# Patient Record
Sex: Male | Born: 1955 | Race: White | Hispanic: No | Marital: Married | Smoking: Never smoker
Health system: Southern US, Community
[De-identification: ages and names within clinical notes are randomized; demographics above are authoritative.]

## PROBLEM LIST (undated history)

## (undated) DIAGNOSIS — I10 Essential (primary) hypertension: Secondary | ICD-10-CM

## (undated) DIAGNOSIS — E785 Hyperlipidemia, unspecified: Secondary | ICD-10-CM

## (undated) DIAGNOSIS — H3322 Serous retinal detachment, left eye: Secondary | ICD-10-CM

---

## 2004-08-02 ENCOUNTER — Ambulatory Visit: Payer: Self-pay | Admitting: Unknown Physician Specialty

## 2009-04-11 ENCOUNTER — Ambulatory Visit: Payer: Self-pay | Admitting: Unknown Physician Specialty

## 2019-05-11 DIAGNOSIS — L570 Actinic keratosis: Secondary | ICD-10-CM | POA: Diagnosis not present

## 2019-05-11 DIAGNOSIS — D0461 Carcinoma in situ of skin of right upper limb, including shoulder: Secondary | ICD-10-CM | POA: Diagnosis not present

## 2019-05-11 DIAGNOSIS — X32XXXA Exposure to sunlight, initial encounter: Secondary | ICD-10-CM | POA: Diagnosis not present

## 2019-05-26 DIAGNOSIS — R110 Nausea: Secondary | ICD-10-CM | POA: Diagnosis not present

## 2019-05-26 DIAGNOSIS — R1011 Right upper quadrant pain: Secondary | ICD-10-CM | POA: Diagnosis not present

## 2019-07-09 DIAGNOSIS — H524 Presbyopia: Secondary | ICD-10-CM | POA: Diagnosis not present

## 2019-07-24 DIAGNOSIS — E785 Hyperlipidemia, unspecified: Secondary | ICD-10-CM | POA: Diagnosis not present

## 2019-07-24 DIAGNOSIS — Z125 Encounter for screening for malignant neoplasm of prostate: Secondary | ICD-10-CM | POA: Diagnosis not present

## 2019-07-24 DIAGNOSIS — I10 Essential (primary) hypertension: Secondary | ICD-10-CM | POA: Diagnosis not present

## 2019-07-24 DIAGNOSIS — Z0000 Encounter for general adult medical examination without abnormal findings: Secondary | ICD-10-CM | POA: Diagnosis not present

## 2019-07-30 DIAGNOSIS — M19049 Primary osteoarthritis, unspecified hand: Secondary | ICD-10-CM | POA: Diagnosis not present

## 2019-07-30 DIAGNOSIS — Z0000 Encounter for general adult medical examination without abnormal findings: Secondary | ICD-10-CM | POA: Diagnosis not present

## 2019-07-30 DIAGNOSIS — E785 Hyperlipidemia, unspecified: Secondary | ICD-10-CM | POA: Diagnosis not present

## 2019-07-30 DIAGNOSIS — I10 Essential (primary) hypertension: Secondary | ICD-10-CM | POA: Diagnosis not present

## 2019-11-15 ENCOUNTER — Encounter: Payer: Self-pay | Admitting: Emergency Medicine

## 2019-11-15 ENCOUNTER — Other Ambulatory Visit: Payer: Self-pay

## 2019-11-15 ENCOUNTER — Emergency Department
Admission: EM | Admit: 2019-11-15 | Discharge: 2019-11-15 | Disposition: A | Discharge status: Home / Self Care | Payer: Medicare HMO | Attending: Emergency Medicine | Admitting: Emergency Medicine

## 2019-11-15 DIAGNOSIS — H539 Unspecified visual disturbance: Secondary | ICD-10-CM | POA: Diagnosis not present

## 2019-11-15 DIAGNOSIS — H538 Other visual disturbances: Secondary | ICD-10-CM | POA: Insufficient documentation

## 2019-11-15 HISTORY — DX: Serous retinal detachment, left eye: H33.22

## 2019-11-15 NOTE — ED Provider Notes (Signed)
[facility] Emergency Department Provider Note ____________________________________________  Time seen: Approximately 2:06 PM  I have reviewed the triage vital signs and the nursing notes.   HISTORY  Chief Complaint Loss of Vision   HPI Luke Schneider is a 64 y.o. male presents to the emergency department for treatment and evaluation of vision changes in the right eye that started yesterday afternoon. 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 He states that on Thursday he noticed a black floater in the right outer field of vision, but didn't really think much about it. On Friday he noticed that he could see an occasional flash when he closed his eyes. Yesterday, he was on the tractor and noticed that a semicircle area of the lower field of vision was dark. He denies eye pain or injury.  Past Medical History:  Diagnosis Date  . Detached retina, left     There are no problems to display for this patient.  Prior to Admission medications   Not on File    Allergies Patient has no known allergies.  No family history on file.  Social History Social History   Tobacco Use  . Smoking status: Never Smoker  . Smokeless tobacco: Never Used  Substance Use Topics  . Alcohol use: Not Currently  . Drug use: Not Currently    Review of Systems   Constitutional: No fever/chills Eyes: Positive for visual changes. Negative for pain. Negative for drainage. Musculoskeletal: Negative for pain. Skin: Negative for rash. Neurological: Negative for headaches, focal weakness or numbness. Allergic: Negative for seasonal allergies. ____________________________________________  PHYSICAL EXAM:  VITAL SIGNS: ED Triage Vitals  Enc  Vitals Group     BP 11/15/19 1254 120/75     Pulse Rate 11/15/19 1254 62     Resp 11/15/19 1254 18     Temp 11/15/19 1254 98.3 F (36.8 C)     Temp Source 11/15/19 1254 Oral     SpO2 11/15/19 1254 98 %     Weight 11/15/19 1254 230 lb (104.3 kg)     Height 11/15/19 1254 6\' 1"  (1.854 m)     Head Circumference --      Peak Flow --      Pain Score 11/15/19 1251 0     Pain Loc --      Pain Edu? --      Excl. in [place]? --     Constitutional: Alert and oriented. Well appearing and in no acute distress. Eyes: Visual acuity--see nursing documentation; no globe trauma; Eyelids normal to inspection; Sclera appears anicteric.  Eyelids not inverted. Conjunctiva appears clear; Cornea clear. Intraocular pressure reading 21, 20, 20 Head: Atraumatic. Nose: No congestion/rhinnorhea. Mouth/Throat: Mucous membranes are moist.  Oropharynx non-erythematous. Respiratory: Respirations even and unlabored. Breath sounds clear to auscultation. Musculoskeletal:Normal ROM x 4 extremities. Neurologic:  Normal speech and language. No gross focal neurologic deficits are appreciated. Speech is normal. No gait instability. Skin:  Skin is warm, dry and intact. No rash noted. Psychiatric: Mood and affect are normal. Speech and behavior are normal.  ____________________________________________   LABS (all labs ordered are listed, but only abnormal results are displayed)  Labs Reviewed - No data to display ____________________________________________  EKG  Not indicated ____________________________________________  RADIOLOGY  Not indicated ____________________________________________   PROCEDURES  Procedure(s) performed: None ____________________________________________   INITIAL IMPRESSION / ASSESSMENT AND PLAN / ED COURSE  64 year old male presenting to the emergency department for nontraumatic, nonpainful change of vision of the right eye that started yesterday. See HPI for further  details.  Intraocular pressure is normal. Dr. Jari Pigg performed bedside ultrasound which does show an abnormality which is likely retinal detachment versus vitreous hemorrhage.  Case discussed with Dr. Murvin Natal who advised to have the patient be at the office in the morning at 8 AM. He states that the retina specialist should be there at that time as well.  Patient was provided address and phone number of [facility] and is aware that he needs to go to to the [facility] at 8 AM. He was advised that if he has any additional concerns or changes tonight he that he should come back to the emergency department.  Pertinent labs & imaging results that were available during my care of the patient were reviewed by me and considered in my medical decision making (see chart for details). ____________________________________________   FINAL CLINICAL IMPRESSION(S) / ED DIAGNOSES  Final diagnoses:  Changes in vision    Note:  This document was prepared using Dragon voice recognition software and may include unintentional dictation errors.    Victorino Dike, FNP 11/15/19 2115    Vanessa East Douglas, MD 11/16/19 [phone]

## 2019-11-15 NOTE — ED Triage Notes (Signed)
Pt presents to ED via POV, pt states half circle loss of vision to R eye that started yesterday at approx 1530 3/7. Pt states he thinks he has a detached retina due to having same symptoms in L eye several years ago.

## 2019-11-15 NOTE — Discharge Instructions (Signed)
Be at the [facility] at 8:00am to see Dr. Wallace Going and/or the retinal specialist. If you develop pain or other concerns today or tonight, return to the ER.

## 2019-11-16 DIAGNOSIS — H33001 Unspecified retinal detachment with retinal break, right eye: Secondary | ICD-10-CM | POA: Diagnosis not present

## 2019-11-16 DIAGNOSIS — H3321 Serous retinal detachment, right eye: Secondary | ICD-10-CM | POA: Diagnosis not present

## 2019-11-16 DIAGNOSIS — Z961 Presence of intraocular lens: Secondary | ICD-10-CM | POA: Diagnosis not present

## 2019-11-16 DIAGNOSIS — I10 Essential (primary) hypertension: Secondary | ICD-10-CM | POA: Diagnosis not present

## 2019-11-16 DIAGNOSIS — E7800 Pure hypercholesterolemia, unspecified: Secondary | ICD-10-CM | POA: Diagnosis not present

## 2019-11-17 DIAGNOSIS — H338 Other retinal detachments: Secondary | ICD-10-CM | POA: Diagnosis not present

## 2019-11-25 DIAGNOSIS — H33001 Unspecified retinal detachment with retinal break, right eye: Secondary | ICD-10-CM | POA: Diagnosis not present

## 2019-12-29 ENCOUNTER — Other Ambulatory Visit: Payer: Self-pay | Admitting: Podiatry

## 2019-12-29 ENCOUNTER — Encounter: Payer: Self-pay | Admitting: Podiatry

## 2019-12-29 ENCOUNTER — Ambulatory Visit (INDEPENDENT_AMBULATORY_CARE_PROVIDER_SITE_OTHER): Payer: Medicare HMO

## 2019-12-29 ENCOUNTER — Ambulatory Visit: Payer: Medicare HMO | Admitting: Podiatry

## 2019-12-29 ENCOUNTER — Other Ambulatory Visit: Payer: Self-pay

## 2019-12-29 VITALS — Temp 97.9°F

## 2019-12-29 DIAGNOSIS — T148XXA Other injury of unspecified body region, initial encounter: Secondary | ICD-10-CM

## 2019-12-29 DIAGNOSIS — E785 Hyperlipidemia, unspecified: Secondary | ICD-10-CM | POA: Insufficient documentation

## 2019-12-29 DIAGNOSIS — W25XXXA Contact with sharp glass, initial encounter: Secondary | ICD-10-CM

## 2019-12-29 DIAGNOSIS — I10 Essential (primary) hypertension: Secondary | ICD-10-CM | POA: Insufficient documentation

## 2019-12-29 DIAGNOSIS — M722 Plantar fascial fibromatosis: Secondary | ICD-10-CM

## 2019-12-29 DIAGNOSIS — L03119 Cellulitis of unspecified part of limb: Secondary | ICD-10-CM

## 2019-12-29 DIAGNOSIS — L02619 Cutaneous abscess of unspecified foot: Secondary | ICD-10-CM | POA: Diagnosis not present

## 2019-12-29 DIAGNOSIS — M542 Cervicalgia: Secondary | ICD-10-CM | POA: Insufficient documentation

## 2019-12-29 DIAGNOSIS — M795 Residual foreign body in soft tissue: Secondary | ICD-10-CM

## 2019-12-29 DIAGNOSIS — D126 Benign neoplasm of colon, unspecified: Secondary | ICD-10-CM | POA: Insufficient documentation

## 2019-12-29 DIAGNOSIS — Z1881 Retained glass fragments: Secondary | ICD-10-CM

## 2019-12-29 DIAGNOSIS — H8109 Meniere's disease, unspecified ear: Secondary | ICD-10-CM | POA: Insufficient documentation

## 2019-12-29 DIAGNOSIS — L570 Actinic keratosis: Secondary | ICD-10-CM | POA: Insufficient documentation

## 2019-12-29 DIAGNOSIS — M79672 Pain in left foot: Secondary | ICD-10-CM | POA: Diagnosis not present

## 2019-12-29 MED ORDER — GENTAMICIN SULFATE 0.1 % EX CREA: 1.0000 "application " | TOPICAL_CREAM | Freq: Two times a day (BID) | CUTANEOUS | 1 refills | Status: DC

## 2019-12-29 NOTE — Progress Notes (Signed)
   HPI: 64 y.o. male presenting today as a new patient for evaluation of left heel pain.  Patient has been experiencing pain for the past 1-2 months now.  He feels like it is a deep stone bruise.  Aggravated by walking.  He has not done anything for treatment.  Past Medical History:  Diagnosis Date  . Detached retina, left      Physical Exam: General: The patient is alert and oriented x3 in no acute distress.  Dermatology: Skin is warm, dry and supple bilateral lower extremities. Negative for open lesions or macerations.  There is a small lesion to the plantar aspect of the left heel.  Upon debridement and superficial incision purulent drainage was expressed.  A glass shard approximately 3 mm in length was removed from the superficial tissues of the left plantar heel.    Vascular: Palpable pedal pulses bilaterally. No edema or erythema noted. Capillary refill within normal limits.  Neurological: Epicritic and protective threshold grossly intact bilaterally.   Musculoskeletal Exam: Range of motion within normal limits to all pedal and ankle joints bilateral. Muscle strength 5/5 in all groups bilateral.   Radiographic Exam:  Normal osseous mineralization. Joint spaces preserved. No fracture/dislocation/boney destruction.    Assessment: 1.  Foreign body abscess left plantar heel   Plan of Care:  1. Patient evaluated. X-Rays reviewed.  2.  Excisional debridement and incision with drainage of the purulence was performed to the left plantar heel.  The glass shard was removed.  Inspection of the area was performed to ensure there is no additional foreign body within the soft tissue. 3.  Prescription for gentamicin cream applied daily 4.  Return to clinic as needed      Edrick Kins, DPM [facility]  Dr. Edrick Kins, DPM    [address], [place] [address]                Office [phone]  Fax [phone]

## 2020-02-03 DIAGNOSIS — Z85828 Personal history of other malignant neoplasm of skin: Secondary | ICD-10-CM | POA: Diagnosis not present

## 2020-02-03 DIAGNOSIS — X32XXXD Exposure to sunlight, subsequent encounter: Secondary | ICD-10-CM | POA: Diagnosis not present

## 2020-02-03 DIAGNOSIS — L570 Actinic keratosis: Secondary | ICD-10-CM | POA: Diagnosis not present

## 2020-02-03 DIAGNOSIS — Z08 Encounter for follow-up examination after completed treatment for malignant neoplasm: Secondary | ICD-10-CM | POA: Diagnosis not present

## 2020-03-22 DIAGNOSIS — Z01818 Encounter for other preprocedural examination: Secondary | ICD-10-CM | POA: Diagnosis not present

## 2020-03-22 DIAGNOSIS — Z86010 Personal history of colonic polyps: Secondary | ICD-10-CM | POA: Diagnosis not present

## 2020-05-08 DIAGNOSIS — Z03818 Encounter for observation for suspected exposure to other biological agents ruled out: Secondary | ICD-10-CM | POA: Diagnosis not present

## 2020-05-08 DIAGNOSIS — U071 COVID-19: Secondary | ICD-10-CM | POA: Diagnosis not present

## 2020-05-09 DIAGNOSIS — Z03818 Encounter for observation for suspected exposure to other biological agents ruled out: Secondary | ICD-10-CM | POA: Diagnosis not present

## 2020-05-09 DIAGNOSIS — U071 COVID-19: Secondary | ICD-10-CM | POA: Diagnosis not present

## 2020-05-09 DIAGNOSIS — Z20822 Contact with and (suspected) exposure to covid-19: Secondary | ICD-10-CM | POA: Diagnosis not present

## 2020-05-10 ENCOUNTER — Encounter: Payer: Self-pay | Admitting: Emergency Medicine

## 2020-05-10 ENCOUNTER — Other Ambulatory Visit: Payer: Self-pay

## 2020-05-10 ENCOUNTER — Emergency Department: Payer: Medicare HMO

## 2020-05-10 DIAGNOSIS — U071 COVID-19: Secondary | ICD-10-CM | POA: Diagnosis present

## 2020-05-10 DIAGNOSIS — Z8249 Family history of ischemic heart disease and other diseases of the circulatory system: Secondary | ICD-10-CM

## 2020-05-10 DIAGNOSIS — J1282 Pneumonia due to coronavirus disease 2019: Secondary | ICD-10-CM | POA: Diagnosis present

## 2020-05-10 DIAGNOSIS — N1831 Chronic kidney disease, stage 3a: Secondary | ICD-10-CM | POA: Diagnosis present

## 2020-05-10 DIAGNOSIS — E869 Volume depletion, unspecified: Secondary | ICD-10-CM | POA: Diagnosis present

## 2020-05-10 DIAGNOSIS — R918 Other nonspecific abnormal finding of lung field: Secondary | ICD-10-CM | POA: Diagnosis not present

## 2020-05-10 DIAGNOSIS — Z79899 Other long term (current) drug therapy: Secondary | ICD-10-CM

## 2020-05-10 DIAGNOSIS — I129 Hypertensive chronic kidney disease with stage 1 through stage 4 chronic kidney disease, or unspecified chronic kidney disease: Secondary | ICD-10-CM | POA: Diagnosis present

## 2020-05-10 DIAGNOSIS — E785 Hyperlipidemia, unspecified: Secondary | ICD-10-CM | POA: Diagnosis present

## 2020-05-10 DIAGNOSIS — J9600 Acute respiratory failure, unspecified whether with hypoxia or hypercapnia: Secondary | ICD-10-CM | POA: Diagnosis not present

## 2020-05-10 DIAGNOSIS — N179 Acute kidney failure, unspecified: Secondary | ICD-10-CM | POA: Diagnosis present

## 2020-05-10 DIAGNOSIS — R0602 Shortness of breath: Secondary | ICD-10-CM | POA: Diagnosis not present

## 2020-05-10 DIAGNOSIS — J9601 Acute respiratory failure with hypoxia: Secondary | ICD-10-CM | POA: Diagnosis present

## 2020-05-10 DIAGNOSIS — N189 Chronic kidney disease, unspecified: Secondary | ICD-10-CM | POA: Diagnosis not present

## 2020-05-10 DIAGNOSIS — I10 Essential (primary) hypertension: Secondary | ICD-10-CM | POA: Diagnosis not present

## 2020-05-10 LAB — TROPONIN I (HIGH SENSITIVITY)
Troponin I (High Sensitivity): 8 ng/L (ref <18)
Troponin I (High Sensitivity): 8 ng/L (ref <18)

## 2020-05-10 LAB — BASIC METABOLIC PANEL
Anion gap: 12 (ref 5–15)
BUN: 27 mg/dL — ABNORMAL HIGH (ref 8–23)
CO2: 26 mmol/L (ref 22–32)
Calcium: 8.2 mg/dL — ABNORMAL LOW (ref 8.9–10.3)
Chloride: 97 mmol/L — ABNORMAL LOW (ref 98–111)
Creatinine, Ser: 1.54 mg/dL — ABNORMAL HIGH (ref 0.61–1.24)
GFR calc Af Amer: 54 mL/min — ABNORMAL LOW (ref >60)
GFR calc non Af Amer: 46 mL/min — ABNORMAL LOW (ref >60)
Glucose, Bld: 103 mg/dL — ABNORMAL HIGH (ref 70–99)
Potassium: 3.8 mmol/L (ref 3.5–5.1)
Sodium: 135 mmol/L (ref 135–145)

## 2020-05-10 LAB — CBC
HCT: 46.7 % (ref 39.0–52.0)
Hemoglobin: 16.6 g/dL (ref 13.0–17.0)
MCH: 32.4 pg (ref 26.0–34.0)
MCHC: 35.5 g/dL (ref 30.0–36.0)
MCV: 91.2 fL (ref 80.0–100.0)
Platelets: 160 10*3/uL (ref 150–400)
RBC: 5.12 MIL/uL (ref 4.22–5.81)
RDW: 12.6 % (ref 11.5–15.5)
WBC: 3.9 10*3/uL — ABNORMAL LOW (ref 4.0–10.5)
nRBC: 0 % (ref 0.0–0.2)

## 2020-05-10 MED ORDER — ACETAMINOPHEN 500 MG PO TABS
1000.0000 mg | ORAL_TABLET | Freq: Once | ORAL | Status: AC
  Administered 2020-05-10: 1000 mg via ORAL

## 2020-05-10 MED ORDER — ACETAMINOPHEN 500 MG PO TABS
ORAL_TABLET | ORAL | Status: AC
  Filled 2020-05-10: qty 2

## 2020-05-10 NOTE — ED Triage Notes (Signed)
Pt to ED from home c/o worsening SOB, burning when he breathes, body aches.  States positive COVID results yesterday.  Was told by RN at PCP to use Zinc and Vitamin C and see how it goes and if it gets worse he can't go there and to come to ED.  Pt speaking in complete and coherent sentences, chest rise even and unlabored, and in NAD at this time.  Pt states did not receive COVID vaccine, recently traveled to [place] and [place].

## 2020-05-11 ENCOUNTER — Inpatient Hospital Stay
Admission: EM | Admit: 2020-05-11 | Discharge: 2020-05-12 | DRG: 177 | Disposition: A | Discharge status: Home / Self Care | Payer: Medicare HMO | Attending: Internal Medicine | Admitting: Internal Medicine

## 2020-05-11 DIAGNOSIS — J9601 Acute respiratory failure with hypoxia: Secondary | ICD-10-CM | POA: Diagnosis present

## 2020-05-11 DIAGNOSIS — J1282 Pneumonia due to coronavirus disease 2019: Secondary | ICD-10-CM | POA: Diagnosis present

## 2020-05-11 DIAGNOSIS — E785 Hyperlipidemia, unspecified: Secondary | ICD-10-CM | POA: Diagnosis present

## 2020-05-11 DIAGNOSIS — U071 COVID-19: Secondary | ICD-10-CM

## 2020-05-11 DIAGNOSIS — N179 Acute kidney failure, unspecified: Secondary | ICD-10-CM

## 2020-05-11 DIAGNOSIS — N1831 Chronic kidney disease, stage 3a: Secondary | ICD-10-CM | POA: Diagnosis present

## 2020-05-11 DIAGNOSIS — J9600 Acute respiratory failure, unspecified whether with hypoxia or hypercapnia: Secondary | ICD-10-CM

## 2020-05-11 DIAGNOSIS — Z8249 Family history of ischemic heart disease and other diseases of the circulatory system: Secondary | ICD-10-CM | POA: Diagnosis not present

## 2020-05-11 DIAGNOSIS — I10 Essential (primary) hypertension: Secondary | ICD-10-CM | POA: Diagnosis not present

## 2020-05-11 DIAGNOSIS — N189 Chronic kidney disease, unspecified: Secondary | ICD-10-CM | POA: Diagnosis not present

## 2020-05-11 DIAGNOSIS — E869 Volume depletion, unspecified: Secondary | ICD-10-CM | POA: Diagnosis present

## 2020-05-11 DIAGNOSIS — I129 Hypertensive chronic kidney disease with stage 1 through stage 4 chronic kidney disease, or unspecified chronic kidney disease: Secondary | ICD-10-CM | POA: Diagnosis present

## 2020-05-11 DIAGNOSIS — Z79899 Other long term (current) drug therapy: Secondary | ICD-10-CM | POA: Diagnosis not present

## 2020-05-11 HISTORY — DX: Essential (primary) hypertension: I10

## 2020-05-11 HISTORY — DX: Hyperlipidemia, unspecified: E78.5

## 2020-05-11 LAB — ABO/RH: ABO/RH(D): B NEG

## 2020-05-11 LAB — SARS CORONAVIRUS 2 BY RT PCR (HOSPITAL ORDER, PERFORMED IN ~~LOC~~ HOSPITAL LAB): SARS Coronavirus 2: POSITIVE — AB

## 2020-05-11 LAB — HIV ANTIBODY (ROUTINE TESTING W REFLEX): HIV Screen 4th Generation wRfx: NONREACTIVE

## 2020-05-11 MED ORDER — SODIUM CHLORIDE 0.9 % IV SOLN: 200.0000 mg | Freq: Once | INTRAVENOUS | Status: DC

## 2020-05-11 MED ORDER — LABETALOL HCL 5 MG/ML IV SOLN: 20.0000 mg | INTRAVENOUS | Status: DC | PRN

## 2020-05-11 MED ORDER — SIMVASTATIN 20 MG PO TABS
20.0000 mg | ORAL_TABLET | Freq: Every day | ORAL | Status: DC
  Administered 2020-05-11: 20 mg via ORAL
  Filled 2020-05-11: qty 1

## 2020-05-11 MED ORDER — GUAIFENESIN-DM 100-10 MG/5ML PO SYRP
10.0000 mL | ORAL_SOLUTION | ORAL | Status: DC | PRN
  Filled 2020-05-11: qty 10

## 2020-05-11 MED ORDER — DEXAMETHASONE SODIUM PHOSPHATE 10 MG/ML IJ SOLN
6.0000 mg | INTRAMUSCULAR | Status: DC
  Administered 2020-05-11: 18:00:00 6 mg via INTRAVENOUS
  Filled 2020-05-11: qty 1

## 2020-05-11 MED ORDER — SODIUM CHLORIDE 0.9 % IV SOLN
200.0000 mg | Freq: Once | INTRAVENOUS | Status: AC
  Administered 2020-05-11: 200 mg via INTRAVENOUS
  Filled 2020-05-11: qty 40

## 2020-05-11 MED ORDER — VITAMIN D 25 MCG (1000 UNIT) PO TABS
1000.0000 [IU] | ORAL_TABLET | Freq: Every day | ORAL | Status: DC
  Administered 2020-05-11 – 2020-05-12 (×2): 1000 [IU] via ORAL
  Filled 2020-05-11 (×2): qty 1

## 2020-05-11 MED ORDER — ONDANSETRON HCL 4 MG/2ML IJ SOLN: 4.0000 mg | Freq: Four times a day (QID) | INTRAMUSCULAR | Status: DC | PRN

## 2020-05-11 MED ORDER — ENOXAPARIN SODIUM 40 MG/0.4ML ~~LOC~~ SOLN
40.0000 mg | SUBCUTANEOUS | Status: DC
  Administered 2020-05-11: 21:00:00 40 mg via SUBCUTANEOUS
  Filled 2020-05-11: qty 0.4

## 2020-05-11 MED ORDER — HYDROCOD POLST-CPM POLST ER 10-8 MG/5ML PO SUER: 5.0000 mL | Freq: Two times a day (BID) | ORAL | Status: DC | PRN

## 2020-05-11 MED ORDER — ACETAMINOPHEN 325 MG PO TABS: 650.0000 mg | ORAL_TABLET | Freq: Four times a day (QID) | ORAL | Status: DC | PRN

## 2020-05-11 MED ORDER — ASCORBIC ACID 500 MG PO TABS
500.0000 mg | ORAL_TABLET | Freq: Every day | ORAL | Status: DC
  Administered 2020-05-11 – 2020-05-12 (×2): 500 mg via ORAL
  Filled 2020-05-11 (×2): qty 1

## 2020-05-11 MED ORDER — SODIUM CHLORIDE 0.9 % IV SOLN: 100.0000 mg | Freq: Every day | INTRAVENOUS | Status: DC

## 2020-05-11 MED ORDER — SODIUM CHLORIDE 0.9 % IV BOLUS
1000.0000 mL | Freq: Once | INTRAVENOUS | Status: AC
  Administered 2020-05-11: 1000 mL via INTRAVENOUS

## 2020-05-11 MED ORDER — GUAIFENESIN ER 600 MG PO TB12
600.0000 mg | ORAL_TABLET | Freq: Two times a day (BID) | ORAL | Status: DC
  Administered 2020-05-11 – 2020-05-12 (×4): 600 mg via ORAL
  Filled 2020-05-11 (×4): qty 1

## 2020-05-11 MED ORDER — FAMOTIDINE 20 MG PO TABS
20.0000 mg | ORAL_TABLET | Freq: Two times a day (BID) | ORAL | Status: DC
  Administered 2020-05-11 – 2020-05-12 (×4): 20 mg via ORAL
  Filled 2020-05-11 (×4): qty 1

## 2020-05-11 MED ORDER — SODIUM CHLORIDE 0.9 % IV SOLN: INTRAVENOUS | Status: DC

## 2020-05-11 MED ORDER — ZINC SULFATE 220 (50 ZN) MG PO CAPS
220.0000 mg | ORAL_CAPSULE | Freq: Every day | ORAL | Status: DC
  Administered 2020-05-11 – 2020-05-12 (×2): 220 mg via ORAL
  Filled 2020-05-11 (×2): qty 1

## 2020-05-11 MED ORDER — DEXAMETHASONE SODIUM PHOSPHATE 10 MG/ML IJ SOLN
10.0000 mg | Freq: Once | INTRAMUSCULAR | Status: AC
  Administered 2020-05-11: 10 mg via INTRAVENOUS
  Filled 2020-05-11: qty 1

## 2020-05-11 MED ORDER — ONDANSETRON HCL 4 MG PO TABS: 4.0000 mg | ORAL_TABLET | Freq: Four times a day (QID) | ORAL | Status: DC | PRN

## 2020-05-11 MED ORDER — IRBESARTAN 75 MG PO TABS: 75.0000 mg | ORAL_TABLET | Freq: Every day | ORAL | Status: DC

## 2020-05-11 MED ORDER — TRAZODONE HCL 50 MG PO TABS: 25.0000 mg | ORAL_TABLET | Freq: Every evening | ORAL | Status: DC | PRN

## 2020-05-11 MED ORDER — MAGNESIUM HYDROXIDE 400 MG/5ML PO SUSP
30.0000 mL | Freq: Every day | ORAL | Status: DC | PRN
  Filled 2020-05-11: qty 30

## 2020-05-11 MED ORDER — SODIUM CHLORIDE 0.9 % IV SOLN
100.0000 mg | Freq: Every day | INTRAVENOUS | Status: DC
  Administered 2020-05-12: 100 mg via INTRAVENOUS
  Filled 2020-05-11: qty 20

## 2020-05-11 NOTE — ED Notes (Signed)
Medications administered per order at this time. Pt repositioned in bed to eat breakfast tray provided by this RN at this time.

## 2020-05-11 NOTE — ED Notes (Signed)
This RN to bedside, pt awakens easily with this RN arrival to bedside. Pt denies any needs. This RN received verbal permission to update patient's wife at this time. Call bell remains within reach at this time. Pt states understanding to use call bell should any further needs arise.

## 2020-05-11 NOTE — ED Notes (Signed)
Pt provided with 2 warm blankets for comfort at this time, denies further needs. Call bell remains within reach. Lights remain dimmed for patient comfort.

## 2020-05-11 NOTE — H&P (Signed)
[place]   PATIENT NAME: Luke Schneider    MR#:  [id number]  DATE OF BIRTH:  July 04, 1956  DATE OF ADMISSION:  05/11/2020  PRIMARY CARE PHYSICIAN: Maryland Pink, MD   REQUESTING/REFERRING PHYSICIAN: Marjean Donna, MD  CHIEF COMPLAINT:   Chief Complaint  Patient presents with  . Shortness of Breath    HISTORY OF PRESENT ILLNESS:  Luke Schneider  is a 64 y.o. Caucasian male with a known history of hypertension and dyslipidemia, who presented to the emergency room with acute onset of generalized weakness and fatigue since yesterday as well as dry cough and dyspnea.  He admitted to loss of appetite.  He denied any loss of taste or smell.  No chest pain or nausea or vomiting or diarrhea.  No headache or dizziness or blurred vision.  He denied any rhinorrhea or nasal congestion or sore throat or earache.  He had fever and chills.  Upon position to the emergency room distention was 100.6 and later 1 was 2.7 and vitals otherwise were within normal.  His pulse oximetry however dropped to 88% on room air after being normal and he was placed on 2 L O2 by nasal cannula and it went up to 97%.  Labs revealed a BUN of 27 creatinine 1.54 with high-sensitivity troponin I that was 8 and later 8.  CBC showed minimal leukopenia of 3.9.  COVID-19 PCR came back positive.  Two-view chest x-ray showed mild multifocal bilateral infiltrates. EKG showed normal sinus rhythm with rate of 96 with Q waves inferiorly.  The patient was given IV remdesivir and Decadron, 1 g of p.o. Tylenol and 1 L bolus of IV normal saline.  He will be admitted to the medical monitored isolation bed for further evaluation and management.  PAST MEDICAL HISTORY:   Past Medical History:  Diagnosis Date  . Detached retina, left   . Hyperlipidemia   . Hypertension     PAST SURGICAL HISTORY:  History reviewed. No pertinent surgical history. He denied any previous surgeries. SOCIAL HISTORY:   Social History   Tobacco  Use  . Smoking status: Never Smoker  . Smokeless tobacco: Never Used  Substance Use Topics  . Alcohol use: Not Currently    FAMILY HISTORY:    Positive hypertension in his father.  DRUG ALLERGIES:  No Known Allergies  REVIEW OF SYSTEMS:   ROS As per history of present illness. All pertinent systems were reviewed above. Constitutional, HEENT, cardiovascular, respiratory, GI, GU, musculoskeletal, neuro, psychiatric, endocrine, integumentary and hematologic systems were reviewed and are otherwise negative/unremarkable except for positive findings mentioned above in the HPI.   MEDICATIONS AT HOME:   Prior to Admission medications   Medication Sig Start Date End Date Taking? Authorizing Provider  simvastatin (ZOCOR) 20 MG tablet TAKE 1 TABLET BY MOUTH EVERY DAY AT NIGHT 09/13/15  Yes [provider]  valsartan (DIOVAN) 80 MG tablet  11/16/19  Yes [provider]  gentamicin cream (GARAMYCIN) 0.1 % Apply 1 application topically 2 (two) times daily. Patient not taking: Reported on 05/11/2020 12/29/19   Edrick Kins, DPM      VITAL SIGNS:  Blood pressure 93/63, pulse 73, temperature (!) 102.7 F (39.3 C), temperature source Oral, resp. rate 18, SpO2 96 %.  PHYSICAL EXAMINATION:  Physical Exam  GENERAL:  65 y.o.-year-old Caucasian male patient lying in the bed with mild respiratory distress with conversational dyspnea. EYES: Pupils equal, round, reactive to light and accommodation. No scleral icterus. Extraocular muscles  intact.  HEENT: Head atraumatic, normocephalic. Oropharynx and nasopharynx clear.  NECK:  Supple, no jugular venous distention. No thyroid enlargement, no tenderness.  LUNGS: Diminished bibasal breath sounds with bibasal crackles. CARDIOVASCULAR: Regular rate and rhythm, S1, S2 normal. No murmurs, rubs, or gallops.  ABDOMEN: Soft, nondistended, nontender. Bowel sounds present. No organomegaly or mass.  EXTREMITIES: No pedal edema, cyanosis, or  clubbing.  NEUROLOGIC: Cranial nerves II through XII are intact. Muscle strength 5/5 in all extremities. Sensation intact. Gait not checked.  PSYCHIATRIC: The patient is alert and oriented x 3.  Normal affect and good eye contact. SKIN: No obvious rash, lesion, or ulcer.   LABORATORY PANEL:   CBC Recent Labs  Lab 05/10/20 1920  WBC 3.9*  HGB 16.6  HCT 46.7  PLT 160   ------------------------------------------------------------------------------------------------------------------  Chemistries  Recent Labs  Lab 05/10/20 1920  NA 135  K 3.8  CL 97*  CO2 26  GLUCOSE 103*  BUN 27*  CREATININE 1.54*  CALCIUM 8.2*   ------------------------------------------------------------------------------------------------------------------  Cardiac Enzymes No results for input(s): TROPONINI in the last 168 hours. ------------------------------------------------------------------------------------------------------------------  RADIOLOGY:  DG Chest 2 View  Result Date: 05/10/2020 CLINICAL DATA:  Shortness of breath. COVID positive. EXAM: CHEST - 2 VIEW COMPARISON:  None. FINDINGS: Mild multifocal infiltrates are seen along the periphery of the mid and lower lung fields, bilaterally. There is no evidence of a pleural effusion or pneumothorax. The heart size and mediastinal contours are within normal limits. The visualized skeletal structures are unremarkable. IMPRESSION: Mild multifocal bilateral infiltrates. Electronically Signed   By: Virgina Norfolk M.D.   On: 05/10/2020 19:34      IMPRESSION AND PLAN:   1.  Acute hypoxemic respiratory failure secondary to COVID-19. -The patient will be admitted to a medically monitored isolation bed. -O2 protocol will be followed to keep O2 saturation above 93.   2.  Multifocal pneumonia secondary to COVID-19. -The patient will be admitted to an isolation monitored bed with droplet and contact precautions. -Given multifocal pneumonia we will  empirically place the patient on IV Rocephin and Zithromax for possible bacterial superinfection only with elevated Procalcitonin. -The patient will be placed on scheduled Mucinex and as needed Tussionex. -We will avoid nebulization as much as we can, give bronchodilator MDI if needed, and with deterioration of oxygenation try to avoid BiPAP/CPAP if possible.    -Will obtain sputum Gram stain culture and sensitivity and follow blood cultures. -O2 protocol will be followed. -We will follow CRP, ferritin, LDH and D-dimer. -Will follow manual differential for ANC/ALC ratio as well as follow troponin I and daily CBC with manual differential and CMP. - Will place the patient on IV Remdesivir and IV steroid therapy with Decadron with elevated inflammatory markers. -The patient will be placed on vitamin D3, vitamin C, zinc sulfate, p.o. Pepcid and aspirin. - I discussed baricitinib with the patient and he agreed to proceed with it.  3.  Acute kidney injury. -This likely secondary to volume depletion from diminished appetite. -He will be placed on hydration with IV normal saline. -We will follow his BMPs.  4.  Essential hypertension. -The patient will be placed on as needed IV labetalol. -We will hold his Diovan HCT given acute kidney injury.  5.  Dyslipidemia. -Statin therapy will be resumed.  6.  DVT prophylaxis. -Subcutaneous Lovenox.   All the records are reviewed and case discussed with ED provider. The plan of care was discussed in details with the patient (and family). I answered all questions.  The patient agreed to proceed with the above mentioned plan. Further management will depend upon hospital course.   CODE STATUS: Full code  Status is: Inpatient  Remains inpatient appropriate because:Ongoing diagnostic testing needed not appropriate for outpatient work up, Unsafe d/c plan, IV treatments appropriate due to intensity of illness or inability to take PO and Inpatient level of  care appropriate due to severity of illness   Dispo: The patient is from: Home              Anticipated d/c is to: Home              Anticipated d/c date is: 3 days              Patient currently is not medically stable to d/c.   TOTAL TIME TAKING CARE OF THIS PATIENT: 55 minutes.    Christel Mormon M.D on 05/11/2020 at 3:18 AM  [facility]   From 7 PM-7 AM, contact night-coverage [email or web address]  CC: Primary care physician; [place] Pink, MD   Note: This dictation was prepared with Dragon dictation along with smaller phrase technology. Any transcriptional typo errors that result from this process are unintentional.

## 2020-05-11 NOTE — ED Notes (Signed)
Pt provided with water pitcher at this time. Pt visualized in NAD at this time. Pt denies further needs. Call bell remains within reach of patient at this time should further needs arise.

## 2020-05-11 NOTE — Progress Notes (Signed)
Patient ID: Luke Schneider, male   DOB: [birth year], 64 y.o.   MRN: [id number] [facility] PROGRESS NOTE  Luke Schneider:[id number] DOB: [birth year] DOA: 05/11/2020 PCP: Maryland Pink, MD  HPI/Subjective: Patient came in with shortness of breath and found to have COVID-19 pneumonia.  Feels a little better now.  Patient states that he did not think he was going to get Covid.  He does have a little bit of a cough.  No wheeze.  Some shortness of breath when he came in but feeling better now.  He was placed on oxygen with low oxygen levels in the emergency room.  Objective: Vitals:   05/11/20 1100 05/11/20 1200  BP: 104/71 120/70  Pulse: 65 66  Resp: 19 (!) 23  Temp: 98.3 F (36.8 C)   SpO2: 95% 95%    Intake/Output Summary (Last 24 hours) at 05/11/2020 1318 Last data filed at 05/11/2020 1244 Gross per 24 hour  Intake 834.33 ml  Output 800 ml  Net 34.33 ml   There were no vitals filed for this visit.  ROS: Review of Systems  Respiratory: Positive for cough and shortness of breath.   Cardiovascular: Negative for palpitations.  Gastrointestinal: Negative for abdominal pain, constipation, nausea and vomiting.   Exam: Physical Exam HENT:     Nose: No mucosal edema.     Mouth/Throat:     Pharynx: No oropharyngeal exudate.  Eyes:     General: Lids are normal.     Conjunctiva/sclera: Conjunctivae normal.     Pupils: Pupils are equal, round, and reactive to light.  Cardiovascular:     Rate and Rhythm: Normal rate and regular rhythm.     Heart sounds: Normal heart sounds, S1 normal and S2 normal.  Pulmonary:     Breath sounds: Examination of the right-middle field reveals decreased breath sounds. Examination of the left-middle field reveals decreased breath sounds. Examination of the right-lower field reveals decreased breath sounds and rhonchi. Examination of the left-lower field reveals decreased breath sounds and rhonchi. Decreased breath sounds and rhonchi present. No  wheezing or rales.  Abdominal:     Palpations: Abdomen is soft.     Tenderness: There is no abdominal tenderness.  Musculoskeletal:     Right ankle: No swelling.  Skin:    General: Skin is warm.     Findings: No rash.  Neurological:     Mental Status: He is alert and oriented to person, place, and time.       Data Reviewed: Basic Metabolic Panel: Recent Labs  Lab 05/10/20 1920  NA 135  K 3.8  CL 97*  CO2 26  GLUCOSE 103*  BUN 27*  CREATININE 1.54*  CALCIUM 8.2*   CBC: Recent Labs  Lab 05/10/20 1920  WBC 3.9*  HGB 16.6  HCT 46.7  MCV 91.2  PLT 160     Recent Results (from the past 240 hour(s))  SARS Coronavirus 2 by RT PCR (hospital order, performed in [facility] hospital lab) Nasopharyngeal Nasopharyngeal Swab     Status: Abnormal   Collection Time: 05/11/20  1:05 AM   Specimen: Nasopharyngeal Swab  Result Value Ref Range Status   SARS Coronavirus 2 POSITIVE (A) NEGATIVE Final    Comment: RESULT CALLED TO, READ BACK BY AND VERIFIED WITH: [place] [phone] 05/11/20 HNM (NOTE) SARS-CoV-2 target nucleic acids are DETECTED  SARS-CoV-2 RNA is generally detectable in upper respiratory specimens  during the acute phase of infection.  Positive results are indicative  of  the presence of the identified virus, but do not rule out bacterial infection or co-infection with other pathogens not detected by the test.  Clinical correlation with patient history and  other diagnostic information is necessary to determine patient infection status.  The expected result is negative.  Fact Sheet for Patients:   StrictlyIdeas.no   Fact Sheet for Healthcare Providers:   BankingDealers.co.za    This test is not yet approved or cleared by the [place] FDA and  has been authorized for detection and/or diagnosis of SARS-CoV-2 by FDA under an Emergency Use Authorization (EUA).  This EUA will remain in effect (meaning this  test  can be used) for the duration of  the COVID-19 declaration under Section 564(b)(1) of the Act, 21 U.S.C. section 360-bbb-3(b)(1), unless the authorization is terminated or revoked sooner.  Performed at [facility], [address] [place],  [address]      Studies: DG Chest 2 View  Result Date: 05/10/2020 CLINICAL DATA:  Shortness of breath. COVID positive. EXAM: CHEST - 2 VIEW COMPARISON:  None. FINDINGS: Mild multifocal infiltrates are seen along the periphery of the mid and lower lung fields, bilaterally. There is no evidence of a pleural effusion or pneumothorax. The heart size and mediastinal contours are within normal limits. The visualized skeletal structures are unremarkable. IMPRESSION: Mild multifocal bilateral infiltrates. Electronically Signed   By: Virgina Norfolk M.D.   On: 05/10/2020 19:34    Scheduled Meds: . vitamin C  500 mg Oral Daily  . cholecalciferol  1,000 Units Oral Daily  . dexamethasone (DECADRON) injection  6 mg Intravenous Q24H  . enoxaparin (LOVENOX) injection  40 mg Subcutaneous Q24H  . famotidine  20 mg Oral BID  . guaiFENesin  600 mg Oral BID  . simvastatin  20 mg Oral q1800  . zinc sulfate  220 mg Oral Daily   Continuous Infusions: . [START ON 05/12/2020] remdesivir 100 mg in NS 100 mL      Assessment/Plan:  1. Acute hypoxic respiratory failure secondary to COVID-19 pneumonia.  Oxygen supplementation.  Try to taper off oxygen during hospital course. 2. Multifocal pneumonia secondary to COVID-19.  Started on remdesivir and Decadron.  Inflammatory markers.  Combivent inhaler.  Zinc and vitamin C. 3. Acute kidney injury.  IV fluid for 1 L and then stop.  Hold Diovan. 4. Hypertension.  Blood pressure on the lower side hold Diovan. 5. Hyperlipidemia unspecified continue Zocor.  Code Status:     Code Status Orders  (From admission, onward)         Start     Ordered   05/11/20 0219  Full code  Continuous        05/11/20  0222        Code Status History    This patient has a current code status but no historical code status.   Advance Care Planning Activity     Family Communication: Spoke with patient's wife on the phone Disposition Plan: Status is: Inpatient  Dispo: The patient is from: Home              Anticipated d/c is to: Home              Anticipated d/c date is: Dependent on whether he comes off the oxygen prior to the 5 days of remdesivir.              Patient currently being treated for acute hypoxic respiratory failure and pneumonia secondary to COVID-19.  Today  is day 1 of remdesivir.  Antibiotics:  Remdesivir  Time spent: 34 minutes  [place]

## 2020-05-11 NOTE — ED Notes (Signed)
Attempted to call report x 1  

## 2020-05-11 NOTE — ED Notes (Signed)
This RN to bedside, introduced self to patient. Pt visualized resting in bed, awakens upon this RN arrival to bedside. Urinal emptied by this RN. Pt A&O x4. Pt remains on 2L via [place]. Visitation policy explained to patient by this RN. Pt states understanding. Denies further needs at this time.

## 2020-05-11 NOTE — Progress Notes (Signed)
Remdesivir - Pharmacy Brief Note    A/P:  Remdesivir 200 mg IVPB once followed by 100 mg IVPB daily x 4 days.   Hart Robinsons, PharmD Clinical Pharmacist  05/11/2020 1:22 AM

## 2020-05-11 NOTE — ED Provider Notes (Signed)
[facility] Emergency Department Provider Note  ____________________________________________   First MD Initiated Contact with Patient 05/11/20 0045     (approximate)  I have reviewed the triage vital signs and the nursing notes.   HISTORY  Chief Complaint Shortness of Breath    HPI Luke Schneider is a 64 y.o. male with below list of previous medical conditions including recent diagnosis of COVID-19 on 05/08/2020 presents to the emergency department secondary to worsening dyspnea generalized body aches chills subjective fever nausea poor p.o. intake.  Patient states that he began feeling ill last Thursday with progressively worsening symptoms since onset.  Patient was tested and within the [place] system for COVID-19 confirmed positive as per care everywhere.  On arrival to the room patient's oxygen saturation currently 89% with the patient tachypneic with a respiratory rate of 26        Past Medical History:  Diagnosis Date  . Detached retina, left   . Hyperlipidemia   . Hypertension     Patient Active Problem List   Diagnosis Date Noted  . Actinic keratosis 12/29/2019  . Adenomatous colon polyp 12/29/2019  . Hyperlipidemia 12/29/2019  . Hypertension 12/29/2019  . Meniere's disease 12/29/2019  . Neck pain 12/29/2019    History reviewed. No pertinent surgical history.  Prior to Admission medications   Medication Sig Start Date End Date Taking? Authorizing Provider  simvastatin (ZOCOR) 20 MG tablet TAKE 1 TABLET BY MOUTH EVERY DAY AT NIGHT 09/13/15  Yes [provider]  valsartan (DIOVAN) 80 MG tablet  11/16/19  Yes [provider]  gentamicin cream (GARAMYCIN) 0.1 % Apply 1 application topically 2 (two) times daily. Patient not taking: Reported on 05/11/2020 12/29/19   Edrick Kins, DPM    Allergies Patient has no known allergies.  History reviewed. No pertinent family history.  Social History Social History   Tobacco  Use  . Smoking status: Never Smoker  . Smokeless tobacco: Never Used  Substance Use Topics  . Alcohol use: Not Currently  . Drug use: Not Currently    Review of Systems Constitutional: Positive for fever/chills Eyes: No visual changes. ENT: No sore throat. Cardiovascular: Denies chest pain. Respiratory: Positive for cough and dyspnea Gastrointestinal: No abdominal pain.  No nausea, no vomiting.  No diarrhea.  No constipation. Genitourinary: Negative for dysuria. Musculoskeletal: Negative for neck pain.  Negative for back pain. Integumentary: Negative for rash. Neurological: Negative for headaches, focal weakness or numbness.  ____________________________________________   PHYSICAL EXAM:  VITAL SIGNS: ED Triage Vitals  Enc Vitals Group     BP 05/10/20 1910 104/69     Pulse Rate 05/10/20 1910 92     Resp 05/10/20 1910 18     Temp 05/10/20 1910 (!) 100.6 F (38.1 C)     Temp Source 05/10/20 1910 Oral     SpO2 05/10/20 1910 97 %     Weight --      Height --      Head Circumference --      Peak Flow --      Pain Score 05/10/20 1917 7     Pain Loc --      Pain Edu? --      Excl. in [place]? --     Constitutional: Alert and oriented.  Eyes: Conjunctivae are normal.  Head: Atraumatic. Mouth/Throat: Patient is wearing a mask. Neck: No stridor.  No meningeal signs.   Cardiovascular: Normal rate, regular rhythm. Good peripheral circulation. Grossly normal heart sounds.  Respiratory: Normal respiratory effort.  No retractions. Gastrointestinal: Soft and nontender. No distention.  Musculoskeletal: No lower extremity tenderness nor edema. No gross deformities of extremities. Neurologic:  Normal speech and language. No gross focal neurologic deficits are appreciated.  Skin:  Skin is warm, dry and intact. Psychiatric: Mood and affect are normal. Speech and behavior are normal.  ____________________________________________   LABS (all labs ordered are listed, but only abnormal  results are displayed)  Labs Reviewed  CBC - Abnormal; Notable for the following components:      Result Value   WBC 3.9 (*)    All other components within normal limits  BASIC METABOLIC PANEL - Abnormal; Notable for the following components:   Chloride 97 (*)    Glucose, Bld 103 (*)    BUN 27 (*)    Creatinine, Ser 1.54 (*)    Calcium 8.2 (*)    GFR calc non Af Amer 46 (*)    GFR calc Af Amer 54 (*)    All other components within normal limits  SARS CORONAVIRUS 2 BY RT PCR (HOSPITAL ORDER, [place] [facility])  TROPONIN I (HIGH SENSITIVITY)  TROPONIN I (HIGH SENSITIVITY)   ____________________________________________  EKG ED ECG REPORT I, [place] N Nayah Lukens, the attending physician, personally viewed and interpreted this ECG.   Date: 05/10/2020  EKG Time: 7:09 PM  Rate: 96  Rhythm: Normal sinus rhythm  Axis: Normal  Intervals: Normal  ST&T Change: None    RADIOLOGY I, [place] N Mary-Anne Polizzi, personally viewed and evaluated these images (plain radiographs) as part of my medical decision making, as well as reviewing the written report by the radiologist.  ED MD interpretation: Mild multifocal bilateral infiltrates on chest x-ray impression per radiologist.  Official radiology report(s): DG Chest 2 View  Result Date: 05/10/2020 CLINICAL DATA:  Shortness of breath. COVID positive. EXAM: CHEST - 2 VIEW COMPARISON:  None. FINDINGS: Mild multifocal infiltrates are seen along the periphery of the mid and lower lung fields, bilaterally. There is no evidence of a pleural effusion or pneumothorax. The heart size and mediastinal contours are within normal limits. The visualized skeletal structures are unremarkable. IMPRESSION: Mild multifocal bilateral infiltrates. Electronically Signed   By: Virgina Norfolk M.D.   On: 05/10/2020 19:34      Procedures   ____________________________________________   INITIAL IMPRESSION / MDM / [place] / ED  COURSE  As part of my medical decision making, I reviewed the following data within the electronic MEDICAL RECORD NUMBER 64 year old male presented with above-stated history and physical exam consistent with COVID-19 infection with hypoxia.  As such patient given Decadron 10 mg IV remdesivir ordered placed.  Patient discussed with hospitalist after hospital admission further evaluation and management. ____________________________________________  FINAL CLINICAL IMPRESSION(S) / ED DIAGNOSES  Final diagnoses:  Pneumonia due to COVID-19 virus     MEDICATIONS GIVEN DURING THIS VISIT:  Medications  sodium chloride 0.9 % bolus 1,000 mL (has no administration in time range)  dexamethasone (DECADRON) injection 10 mg (has no administration in time range)  acetaminophen (TYLENOL) tablet 1,000 mg (1,000 mg Oral Given 05/10/20 2252)     ED Discharge Orders    None      *Please note:  Luke Schneider was evaluated in Emergency Department on 05/11/2020 for the symptoms described in the history of present illness. He was evaluated in the context of the global COVID-19 pandemic, which necessitated consideration that the patient might be at risk for infection with the  SARS-CoV-2 virus that causes COVID-19. Institutional protocols and algorithms that pertain to the evaluation of patients at risk for COVID-19 are in a state of rapid change based on information released by regulatory bodies including the CDC and federal and state organizations. These policies and algorithms were followed during the patient's care in the ED.  Some ED evaluations and interventions may be delayed as a result of limited staffing during and after the pandemic.*  Note:  This document was prepared using Dragon voice recognition software and may include unintentional dictation errors.   Gregor Hams, MD 05/11/20 [phone]

## 2020-05-12 DIAGNOSIS — J9600 Acute respiratory failure, unspecified whether with hypoxia or hypercapnia: Secondary | ICD-10-CM

## 2020-05-12 DIAGNOSIS — N189 Chronic kidney disease, unspecified: Secondary | ICD-10-CM

## 2020-05-12 LAB — CBC WITH DIFFERENTIAL/PLATELET
Abs Immature Granulocytes: 0.01 10*3/uL (ref 0.00–0.07)
Basophils Absolute: 0 10*3/uL (ref 0.0–0.1)
Basophils Relative: 0 %
Eosinophils Absolute: 0 10*3/uL (ref 0.0–0.5)
Eosinophils Relative: 0 %
HCT: 41.9 % (ref 39.0–52.0)
Hemoglobin: 14.7 g/dL (ref 13.0–17.0)
Immature Granulocytes: 0 %
Lymphocytes Relative: 19 %
Lymphs Abs: 0.9 10*3/uL (ref 0.7–4.0)
MCH: 32 pg (ref 26.0–34.0)
MCHC: 35.1 g/dL (ref 30.0–36.0)
MCV: 91.3 fL (ref 80.0–100.0)
Monocytes Absolute: 0.4 10*3/uL (ref 0.1–1.0)
Monocytes Relative: 9 %
Neutro Abs: 3.3 10*3/uL (ref 1.7–7.7)
Neutrophils Relative %: 72 %
Platelets: 169 10*3/uL (ref 150–400)
RBC: 4.59 MIL/uL (ref 4.22–5.81)
RDW: 12.6 % (ref 11.5–15.5)
WBC: 4.6 10*3/uL (ref 4.0–10.5)
nRBC: 0 % (ref 0.0–0.2)

## 2020-05-12 LAB — COMPREHENSIVE METABOLIC PANEL
ALT: 33 U/L (ref 0–44)
AST: 43 U/L — ABNORMAL HIGH (ref 15–41)
Albumin: 3.3 g/dL — ABNORMAL LOW (ref 3.5–5.0)
Alkaline Phosphatase: 54 U/L (ref 38–126)
Anion gap: 9 (ref 5–15)
BUN: 29 mg/dL — ABNORMAL HIGH (ref 8–23)
CO2: 24 mmol/L (ref 22–32)
Calcium: 8 mg/dL — ABNORMAL LOW (ref 8.9–10.3)
Chloride: 103 mmol/L (ref 98–111)
Creatinine, Ser: 1.27 mg/dL — ABNORMAL HIGH (ref 0.61–1.24)
GFR calc Af Amer: >60 mL/min (ref >60)
GFR calc non Af Amer: 58 mL/min — ABNORMAL LOW (ref >60)
Glucose, Bld: 149 mg/dL — ABNORMAL HIGH (ref 70–99)
Potassium: 4.2 mmol/L (ref 3.5–5.1)
Sodium: 136 mmol/L (ref 135–145)
Total Bilirubin: 1.1 mg/dL (ref 0.3–1.2)
Total Protein: 6.5 g/dL (ref 6.5–8.1)

## 2020-05-12 LAB — PROCALCITONIN: Procalcitonin: <0.1 ng/mL

## 2020-05-12 LAB — C-REACTIVE PROTEIN: CRP: 0.5 mg/dL (ref <1.0)

## 2020-05-12 LAB — FIBRIN DERIVATIVES D-DIMER (ARMC ONLY): Fibrin derivatives D-dimer (ARMC): 756.54 ng/mL (FEU) — ABNORMAL HIGH (ref 0.00–499.00)

## 2020-05-12 LAB — FERRITIN: Ferritin: 1159 ng/mL — ABNORMAL HIGH (ref 24–336)

## 2020-05-12 MED ORDER — BARICITINIB 2 MG PO TABS: 4.0000 mg | ORAL_TABLET | Freq: Every day | ORAL | Status: DC

## 2020-05-12 MED ORDER — ALBUTEROL SULFATE HFA 108 (90 BASE) MCG/ACT IN AERS: 2.0000 | INHALATION_SPRAY | Freq: Four times a day (QID) | RESPIRATORY_TRACT | 0 refills | Status: DC | PRN

## 2020-05-12 MED ORDER — DEXAMETHASONE 6 MG PO TABS: 6.0000 mg | ORAL_TABLET | Freq: Every day | ORAL | 0 refills | Status: DC

## 2020-05-12 MED ORDER — ZINC SULFATE 220 (50 ZN) MG PO CAPS: 220.0000 mg | ORAL_CAPSULE | Freq: Every day | ORAL | 0 refills | Status: DC

## 2020-05-12 MED ORDER — ASCORBIC ACID 500 MG PO TABS
500.0000 mg | ORAL_TABLET | Freq: Every day | ORAL | 0 refills | Status: DC
Start: 2020-05-13 — End: 2021-02-14

## 2020-05-12 MED ORDER — VITAMIN D3 25 MCG PO TABS
1000.0000 [IU] | ORAL_TABLET | Freq: Every day | ORAL | 0 refills | Status: DC
Start: 2020-05-13 — End: 2021-02-14

## 2020-05-12 NOTE — Discharge Summary (Signed)
[facility] NAME: Luke Schneider    MR#:  [id number]  DATE OF BIRTH:  1956-01-08  DATE OF ADMISSION:  05/11/2020 ADMITTING PHYSICIAN: Christel Mormon, MD  DATE OF DISCHARGE: 05/12/2020  2:47 PM  PRIMARY CARE PHYSICIAN: Maryland Pink, MD    ADMISSION DIAGNOSIS:  Pneumonia due to COVID-19 virus [U07.1, J12.82]  DISCHARGE DIAGNOSIS:  Active Problems:   Pneumonia due to COVID-19 virus   SECONDARY DIAGNOSIS:   Past Medical History:  Diagnosis Date  . Detached retina, left   . Hyperlipidemia   . Hypertension     HOSPITAL COURSE:   1.  Acute hypoxic respiratory failure secondary to COVID-19 pneumonia.  The patient was given oxygen supplementation.  The patient had been tapered off oxygen this morning and walked around holding his saturations.  Patient felt better and wanted to go home. 2.  Multifocal pneumonia secondary to COVID-19.  Patient was started on remdesivir and finished day 2 here.  He was set up with outpatient remdesivir infusions for 3 more days.  Given an inhaler, zinc and vitamin C.  Will be switched over to Decadron continue 10 days. 3.  Acute kidney injury on chronic kidney disease stage IIIa.  Patient was given a liter of IV fluids.  Creatinine improved from 1.54 down to 1.27.  Patient's baseline creatinine ranges between 1.0 and 1.3. 4.  Essential hypertension.  Blood pressure on the lower side.  Continue to hold Diovan. 5.  Hyperlipidemia unspecified continue Zocor  DISCHARGE CONDITIONS:   Satisfactory  CONSULTS OBTAINED:  None  DRUG ALLERGIES:  No Known Allergies  DISCHARGE MEDICATIONS:   Allergies as of 05/12/2020   No Known Allergies     Medication List    STOP taking these medications   gentamicin cream 0.1 % Commonly known as: GARAMYCIN   valsartan 80 MG tablet Commonly known as: DIOVAN     TAKE these medications   albuterol 108 (90 Base) MCG/ACT inhaler Commonly known as: VENTOLIN  HFA Inhale 2 puffs into the lungs every 6 (six) hours as needed for wheezing or shortness of breath.   ascorbic acid 500 MG tablet Commonly known as: VITAMIN C Take 1 tablet (500 mg total) by mouth daily. Start taking on: May 13, 2020   dexamethasone 6 MG tablet Commonly known as: DECADRON Take 1 tablet (6 mg total) by mouth daily. Start taking on: May 13, 2020   simvastatin 20 MG tablet Commonly known as: ZOCOR TAKE 1 TABLET BY MOUTH EVERY DAY AT NIGHT   Vitamin D3 25 MCG tablet Commonly known as: Vitamin D Take 1 tablet (1,000 Units total) by mouth daily. Start taking on: May 13, 2020   zinc sulfate 220 (50 Zn) MG capsule Take 1 capsule (220 mg total) by mouth daily. Start taking on: May 13, 2020        DISCHARGE INSTRUCTIONS:   Follow-up PCP 2 weeks Follow-up remdesivir infusions starting tomorrow for 3 more days  If you experience worsening of your admission symptoms, develop shortness of breath, life threatening emergency, suicidal or homicidal thoughts you must seek medical attention immediately by calling 911 or calling your MD immediately  if symptoms less severe.  You Must read complete instructions/literature along with all the possible adverse reactions/side effects for all the Medicines you take and that have been prescribed to you. Take any new Medicines after you have completely understood and accept all the possible adverse reactions/side effects.   Please note  You were cared for by a hospitalist during your hospital stay. If you have any questions about your discharge medications or the care you received while you were in the hospital after you are discharged, you can call the unit and asked to speak with the hospitalist on call if the hospitalist that took care of you is not available. Once you are discharged, your primary care physician will handle any further medical issues. Please note that NO REFILLS for any discharge medications will  be authorized once you are discharged, as it is imperative that you return to your primary care physician (or establish a relationship with a primary care physician if you do not have one) for your aftercare needs so that they can reassess your need for medications and monitor your lab values.    Today   CHIEF COMPLAINT:   Chief Complaint  Patient presents with  . Shortness of Breath    HISTORY OF PRESENT ILLNESS:  Luke Schneider  is a 64 y.o. male coming in with shortness of breath and found to have Covid 19 pneumonia and acute hypoxic respiratory failure   VITAL SIGNS:  Blood pressure 97/65, pulse (!) 55, temperature 97.9 F (36.6 C), temperature source Oral, resp. rate 16, SpO2 93 %.  I/O:    Intake/Output Summary (Last 24 hours) at 05/12/2020 1611 Last data filed at 05/12/2020 0900 Gross per 24 hour  Intake 240 ml  Output 1 ml  Net 239 ml    PHYSICAL EXAMINATION:  GENERAL:  64 y.o.-year-old patient lying in the bed with no acute distress.  EYES: Pupils equal, round, reactive to light and accommodation. No scleral icterus. Extraocular muscles intact.  HEENT: Head atraumatic, normocephalic. Oropharynx and nasopharynx clear.  LUNGS: Decreased breath sounds bilateral bases, no wheezing, rales,rhonchi or crepitation. No use of accessory muscles of respiration.  CARDIOVASCULAR: S1, S2 normal. No murmurs, rubs, or gallops.  ABDOMEN: Soft, non-tender, non-distended. Bowel sounds present. No organomegaly or mass.  EXTREMITIES: No pedal edema, cyanosis, or clubbing.  NEUROLOGIC: Cranial nerves II through XII are intact. Muscle strength 5/5 in all extremities. Sensation intact. Gait not checked.  PSYCHIATRIC: The patient is alert and oriented x 3.  SKIN: No obvious rash, lesion, or ulcer.   DATA REVIEW:   CBC Recent Labs  Lab 05/12/20 0433  WBC 4.6  HGB 14.7  HCT 41.9  PLT 169    Chemistries  Recent Labs  Lab 05/12/20 0433  NA 136  K 4.2  CL 103  CO2 24  GLUCOSE  149*  BUN 29*  CREATININE 1.27*  CALCIUM 8.0*  AST 43*  ALT 33  ALKPHOS 54  BILITOT 1.1     Microbiology Results  Results for orders placed or performed during the hospital encounter of 05/11/20  SARS Coronavirus 2 by RT PCR (hospital order, performed in [facility] hospital lab) Nasopharyngeal Nasopharyngeal Swab     Status: Abnormal   Collection Time: 05/11/20  1:05 AM   Specimen: Nasopharyngeal Swab  Result Value Ref Range Status   SARS Coronavirus 2 POSITIVE (A) NEGATIVE Final    Comment: RESULT CALLED TO, READ BACK BY AND VERIFIED WITH: [place] 1610 05/11/20 HNM (NOTE) SARS-CoV-2 target nucleic acids are DETECTED  SARS-CoV-2 RNA is generally detectable in upper respiratory specimens  during the acute phase of infection.  Positive results are indicative  of the presence of the identified virus, but do not rule out bacterial infection or co-infection with other pathogens not detected by the test.  Clinical correlation with patient history and  other diagnostic information is necessary to determine patient infection status.  The expected result is negative.  Fact Sheet for Patients:   StrictlyIdeas.no   Fact Sheet for Healthcare Providers:   BankingDealers.co.za    This test is not yet approved or cleared by the [place] FDA and  has been authorized for detection and/or diagnosis of SARS-CoV-2 by FDA under an Emergency Use Authorization (EUA).  This EUA will remain in effect (meaning this test  can be used) for the duration of  the COVID-19 declaration under Section 564(b)(1) of the Act, 21 U.S.C. section 360-bbb-3(b)(1), unless the authorization is terminated or revoked sooner.  Performed at [facility], [address], [place], [place] [address]     RADIOLOGY:  DG Chest 2 View  Result Date: 05/10/2020 CLINICAL DATA:  Shortness of breath. COVID positive. EXAM: CHEST - 2 VIEW COMPARISON:  None.  FINDINGS: Mild multifocal infiltrates are seen along the periphery of the mid and lower lung fields, bilaterally. There is no evidence of a pleural effusion or pneumothorax. The heart size and mediastinal contours are within normal limits. The visualized skeletal structures are unremarkable. IMPRESSION: Mild multifocal bilateral infiltrates. Electronically Signed   By: Virgina Norfolk M.D.   On: 05/10/2020 19:34     Management plans discussed with the patient, family and they are in agreement.  CODE STATUS:     Code Status Orders  (From admission, onward)         Start     Ordered   05/11/20 0219  Full code  Continuous        05/11/20 0222        Code Status History    This patient has a current code status but no historical code status.   Advance Care Planning Activity      TOTAL TIME TAKING CARE OF THIS PATIENT: 35 minutes.    Loletha Grayer M.D on 05/12/2020 at 4:11 PM  Between 7am to 6pm - Pager - [phone]  After 6pm go to [email or web address] - password [email or web address]  [facility]  CC: Primary care physician; [place] Pink, MD

## 2020-05-12 NOTE — Progress Notes (Addendum)
Pt AAox4, VS stable, spo2 sats 93%-95% on room air and ambulating.  Pt discharge instructions reviewed with patient at bedside no questions or concerns. IV removed Pt took belongings. Wife to transport patient home.

## 2020-05-12 NOTE — Discharge Instructions (Signed)
Patient scheduled for outpatient Remdesivir infusions at 12pm on Friday 9/3, Saturday 9/4, and Sunday 9/5 at [facility]. Please inform the patient to park at [place], as staff will be escorting the patient through the [place] entrance of the hospital. Appointments will take approximately 45 minutes.    There is a wave flag banner located near the entrance on [address]. Turn into this entrance and immediately turn left and park in 1 of the 5 designated Covid Infusion Parking spots. There is a phone number on the sign, please call and let the staff know what spot you are in and we will come out and get you. For questions call [phone].  Thanks.

## 2020-05-12 NOTE — Progress Notes (Signed)
Patient scheduled for outpatient Remdesivir infusions at 12pm on Friday 9/3, Saturday 9/4, and Sunday 9/5 at [facility]. Please inform the patient to park at [place], as staff will be escorting the patient through the [place] entrance of the hospital. Appointments will take approximately 45 minutes.    There is a wave flag banner located near the entrance on [address]. Turn into this entrance and immediately turn left and park in 1 of the 5 designated Covid Infusion Parking spots. There is a phone number on the sign, please call and let the staff know what spot you are in and we will come out and get you. For questions call [phone].  Thanks.

## 2020-05-13 ENCOUNTER — Ambulatory Visit (HOSPITAL_COMMUNITY)
Admission: RE | Admit: 2020-05-13 | Discharge: 2020-05-13 | Disposition: A | Discharge status: Home / Self Care | Payer: Medicare HMO | Source: Ambulatory Visit | Attending: Pulmonary Disease | Admitting: Pulmonary Disease

## 2020-05-13 DIAGNOSIS — J1289 Other viral pneumonia: Secondary | ICD-10-CM | POA: Insufficient documentation

## 2020-05-13 DIAGNOSIS — U071 COVID-19: Secondary | ICD-10-CM | POA: Diagnosis not present

## 2020-05-13 MED ORDER — METHYLPREDNISOLONE SODIUM SUCC 125 MG IJ SOLR: 125.0000 mg | Freq: Once | INTRAMUSCULAR | Status: DC | PRN

## 2020-05-13 MED ORDER — SODIUM CHLORIDE 0.9 % IV SOLN
100.0000 mg | Freq: Once | INTRAVENOUS | Status: AC
  Administered 2020-05-13: 100 mg via INTRAVENOUS
  Filled 2020-05-13: qty 20

## 2020-05-13 MED ORDER — SODIUM CHLORIDE 0.9 % IV SOLN: INTRAVENOUS | Status: DC | PRN

## 2020-05-13 MED ORDER — EPINEPHRINE 0.3 MG/0.3ML IJ SOAJ: 0.3000 mg | Freq: Once | INTRAMUSCULAR | Status: DC | PRN

## 2020-05-13 MED ORDER — DIPHENHYDRAMINE HCL 50 MG/ML IJ SOLN: 50.0000 mg | Freq: Once | INTRAMUSCULAR | Status: DC | PRN

## 2020-05-13 MED ORDER — FAMOTIDINE IN NACL 20-0.9 MG/50ML-% IV SOLN: 20.0000 mg | Freq: Once | INTRAVENOUS | Status: DC | PRN

## 2020-05-13 MED ORDER — ALBUTEROL SULFATE HFA 108 (90 BASE) MCG/ACT IN AERS: 2.0000 | INHALATION_SPRAY | Freq: Once | RESPIRATORY_TRACT | Status: DC | PRN

## 2020-05-13 NOTE — Discharge Instructions (Signed)
10 Things You Can Do to Manage Your COVID-19 Symptoms at Home If you have possible or confirmed COVID-19: 1. Stay home from work and school. And stay away from other public places. If you must go out, avoid using any kind of public transportation, ridesharing, or taxis. 2. Monitor your symptoms carefully. If your symptoms get worse, call your healthcare provider immediately. 3. Get rest and stay hydrated. 4. If you have a medical appointment, call the healthcare provider ahead of time and tell them that you have or may have COVID-19. 5. For medical emergencies, call 911 and notify the dispatch personnel that you have or may have COVID-19. 6. Cover your cough and sneezes with a tissue or use the inside of your elbow. 7. Wash your hands often with soap and water for at least 20 seconds or clean your hands with an alcohol-based hand sanitizer that contains at least 60% alcohol. 8. As much as possible, stay in a specific room and away from other people in your home. Also, you should use a separate bathroom, if available. If you need to be around other people in or outside of the home, wear a mask. 9. Avoid sharing personal items with other people in your household, like dishes, towels, and bedding. 10. Clean all surfaces that are touched often, like counters, tabletops, and doorknobs. Use household cleaning sprays or wipes according to the label instructions. cdc.gov/coronavirus 03/11/2019 This information is not intended to replace advice given to you by your health care provider. Make sure you discuss any questions you have with your health care provider. Document Revised: 08/13/2019 Document Reviewed: 08/13/2019 Elsevier Patient Education  2020 Elsevier Inc.  

## 2020-05-13 NOTE — Progress Notes (Signed)
  Diagnosis: COVID-19  Physician:Dr. Asencion Noble  Procedure: Covid Infusion Clinic Med: remdesivir infusion - Provided patient with remdesivir fact sheet for patients, parents and caregivers prior to infusion.  Complications: No immediate complications noted.  Discharge: Discharged home   [place] 05/13/2020

## 2020-05-14 ENCOUNTER — Ambulatory Visit (HOSPITAL_COMMUNITY)
Admit: 2020-05-14 | Discharge: 2020-05-14 | Disposition: A | Discharge status: Home / Self Care | Payer: Medicare HMO | Attending: Pulmonary Disease | Admitting: Pulmonary Disease

## 2020-05-14 DIAGNOSIS — J1282 Pneumonia due to coronavirus disease 2019: Secondary | ICD-10-CM | POA: Diagnosis not present

## 2020-05-14 DIAGNOSIS — U071 COVID-19: Secondary | ICD-10-CM | POA: Diagnosis not present

## 2020-05-14 MED ORDER — METHYLPREDNISOLONE SODIUM SUCC 125 MG IJ SOLR: 125.0000 mg | Freq: Once | INTRAMUSCULAR | Status: DC | PRN

## 2020-05-14 MED ORDER — ALBUTEROL SULFATE HFA 108 (90 BASE) MCG/ACT IN AERS: 2.0000 | INHALATION_SPRAY | Freq: Once | RESPIRATORY_TRACT | Status: DC | PRN

## 2020-05-14 MED ORDER — EPINEPHRINE 0.3 MG/0.3ML IJ SOAJ: 0.3000 mg | Freq: Once | INTRAMUSCULAR | Status: DC | PRN

## 2020-05-14 MED ORDER — SODIUM CHLORIDE 0.9 % IV SOLN: INTRAVENOUS | Status: DC | PRN

## 2020-05-14 MED ORDER — DIPHENHYDRAMINE HCL 50 MG/ML IJ SOLN: 50.0000 mg | Freq: Once | INTRAMUSCULAR | Status: DC | PRN

## 2020-05-14 MED ORDER — SODIUM CHLORIDE 0.9 % IV SOLN
100.0000 mg | Freq: Once | INTRAVENOUS | Status: AC
  Administered 2020-05-14: 100 mg via INTRAVENOUS
  Filled 2020-05-14: qty 20

## 2020-05-14 MED ORDER — FAMOTIDINE IN NACL 20-0.9 MG/50ML-% IV SOLN: 20.0000 mg | Freq: Once | INTRAVENOUS | Status: DC | PRN

## 2020-05-14 NOTE — Progress Notes (Signed)
  Diagnosis: COVID-19  Physician: Asencion Noble, MD  Procedure: Covid Infusion Clinic Med: remdesivir infusion - Provided patient with remdesivir fact sheet for patients, parents and caregivers prior to infusion.  Complications: No immediate complications noted.  Discharge: Discharged home   Luke Schneider 05/14/2020

## 2020-05-15 ENCOUNTER — Ambulatory Visit (HOSPITAL_COMMUNITY)
Admit: 2020-05-15 | Discharge: 2020-05-15 | Disposition: A | Discharge status: Home / Self Care | Payer: Medicare HMO | Attending: Pulmonary Disease | Admitting: Pulmonary Disease

## 2020-05-15 DIAGNOSIS — J1282 Pneumonia due to coronavirus disease 2019: Secondary | ICD-10-CM | POA: Diagnosis not present

## 2020-05-15 DIAGNOSIS — U071 COVID-19: Secondary | ICD-10-CM | POA: Insufficient documentation

## 2020-05-15 MED ORDER — EPINEPHRINE 0.3 MG/0.3ML IJ SOAJ: 0.3000 mg | Freq: Once | INTRAMUSCULAR | Status: DC | PRN

## 2020-05-15 MED ORDER — METHYLPREDNISOLONE SODIUM SUCC 125 MG IJ SOLR: 125.0000 mg | Freq: Once | INTRAMUSCULAR | Status: DC | PRN

## 2020-05-15 MED ORDER — FAMOTIDINE IN NACL 20-0.9 MG/50ML-% IV SOLN: 20.0000 mg | Freq: Once | INTRAVENOUS | Status: DC | PRN

## 2020-05-15 MED ORDER — ALBUTEROL SULFATE HFA 108 (90 BASE) MCG/ACT IN AERS: 2.0000 | INHALATION_SPRAY | Freq: Once | RESPIRATORY_TRACT | Status: DC | PRN

## 2020-05-15 MED ORDER — SODIUM CHLORIDE 0.9 % IV SOLN: INTRAVENOUS | Status: DC | PRN

## 2020-05-15 MED ORDER — DIPHENHYDRAMINE HCL 50 MG/ML IJ SOLN: 50.0000 mg | Freq: Once | INTRAMUSCULAR | Status: DC | PRN

## 2020-05-15 MED ORDER — SODIUM CHLORIDE 0.9 % IV SOLN
100.0000 mg | Freq: Once | INTRAVENOUS | Status: AC
  Administered 2020-05-15: 100 mg via INTRAVENOUS
  Filled 2020-05-15: qty 20

## 2020-05-15 NOTE — Progress Notes (Signed)
  Diagnosis: COVID-19  Physician:Dr Joya Gaskins   Procedure: Covid Infusion Clinic Med: remdesivir infusion - Provided patient with remdesivir fact sheet for patients, parents and caregivers prior to infusion.  Complications: No immediate complications noted.  Discharge: Discharged home   Dewaine Oats 05/15/2020

## 2020-05-15 NOTE — Discharge Instructions (Signed)
10 Things You Can Do to Manage Your COVID-19 Symptoms at Home If you have possible or confirmed COVID-19: 1. Stay home from work and school. And stay away from other public places. If you must go out, avoid using any kind of public transportation, ridesharing, or taxis. 2. Monitor your symptoms carefully. If your symptoms get worse, call your healthcare provider immediately. 3. Get rest and stay hydrated. 4. If you have a medical appointment, call the healthcare provider ahead of time and tell them that you have or may have COVID-19. 5. For medical emergencies, call 911 and notify the dispatch personnel that you have or may have COVID-19. 6. Cover your cough and sneezes with a tissue or use the inside of your elbow. 7. Wash your hands often with soap and water for at least 20 seconds or clean your hands with an alcohol-based hand sanitizer that contains at least 60% alcohol. 8. As much as possible, stay in a specific room and away from other people in your home. Also, you should use a separate bathroom, if available. If you need to be around other people in or outside of the home, wear a mask. 9. Avoid sharing personal items with other people in your household, like dishes, towels, and bedding. 10. Clean all surfaces that are touched often, like counters, tabletops, and doorknobs. Use household cleaning sprays or wipes according to the label instructions. cdc.gov/coronavirus 03/11/2019 This information is not intended to replace advice given to you by your health care provider. Make sure you discuss any questions you have with your health care provider. Document Revised: 08/13/2019 Document Reviewed: 08/13/2019 Elsevier Patient Education  2020 Elsevier Inc.  

## 2020-05-17 ENCOUNTER — Other Ambulatory Visit: Payer: Self-pay

## 2020-05-17 NOTE — Patient Outreach (Signed)
[facility]) Care Management  05/17/2020  Luke Schneider 14-Sep-1955 [id number]   EMMI- General Discharge RED ON EMMI ALERT Day # 1 Date: 05/14/20 Red Alert Reason:  Know who to call about changes in condition? No  Scheduled follow-up? No    Outreach attempt: Spoke with patient. He reports he is doing better today.  He reports that he gets winded with activity.  Discussed with patient COVID and recovery and when to seek emergency treatment.  He verbalized understanding.  Patient states he will call his PCP for follow up. Declined needing any assistance making follow up appointment.   Patient declined any need for further follow up.   Plan: RN CM will close case.    Jone Baseman, RN, MSN [facility] Care Management Care Management Coordinator Direct Line [phone] Toll Free: [phone]  Fax: [phone]

## 2020-05-20 DIAGNOSIS — U071 COVID-19: Secondary | ICD-10-CM | POA: Diagnosis not present

## 2020-05-20 DIAGNOSIS — I959 Hypotension, unspecified: Secondary | ICD-10-CM | POA: Diagnosis not present

## 2020-05-20 DIAGNOSIS — K5900 Constipation, unspecified: Secondary | ICD-10-CM | POA: Diagnosis not present

## 2020-05-21 ENCOUNTER — Other Ambulatory Visit: Payer: Self-pay

## 2020-05-21 ENCOUNTER — Emergency Department (HOSPITAL_COMMUNITY)
Admission: EM | Admit: 2020-05-21 | Discharge: 2020-05-21 | Disposition: A | Discharge status: Home / Self Care | Payer: Medicare HMO | Attending: Emergency Medicine | Admitting: Emergency Medicine

## 2020-05-21 ENCOUNTER — Encounter (HOSPITAL_COMMUNITY): Payer: Self-pay | Admitting: Emergency Medicine

## 2020-05-21 DIAGNOSIS — R200 Anesthesia of skin: Secondary | ICD-10-CM | POA: Diagnosis not present

## 2020-05-21 DIAGNOSIS — R0602 Shortness of breath: Secondary | ICD-10-CM | POA: Diagnosis not present

## 2020-05-21 DIAGNOSIS — Z79899 Other long term (current) drug therapy: Secondary | ICD-10-CM | POA: Insufficient documentation

## 2020-05-21 DIAGNOSIS — I10 Essential (primary) hypertension: Secondary | ICD-10-CM | POA: Diagnosis not present

## 2020-05-21 DIAGNOSIS — K5900 Constipation, unspecified: Secondary | ICD-10-CM | POA: Insufficient documentation

## 2020-05-21 DIAGNOSIS — Z8616 Personal history of COVID-19: Secondary | ICD-10-CM | POA: Insufficient documentation

## 2020-05-21 LAB — LACTIC ACID, PLASMA: Lactic Acid, Venous: 1.4 mmol/L (ref 0.5–1.9)

## 2020-05-21 LAB — CBC WITH DIFFERENTIAL/PLATELET
Abs Immature Granulocytes: 0.39 10*3/uL — ABNORMAL HIGH (ref 0.00–0.07)
Basophils Absolute: 0 10*3/uL (ref 0.0–0.1)
Basophils Relative: 0 %
Eosinophils Absolute: 0.2 10*3/uL (ref 0.0–0.5)
Eosinophils Relative: 2 %
HCT: 43.9 % (ref 39.0–52.0)
Hemoglobin: 14.8 g/dL (ref 13.0–17.0)
Immature Granulocytes: 3 %
Lymphocytes Relative: 13 %
Lymphs Abs: 1.7 10*3/uL (ref 0.7–4.0)
MCH: 31.4 pg (ref 26.0–34.0)
MCHC: 33.7 g/dL (ref 30.0–36.0)
MCV: 93 fL (ref 80.0–100.0)
Monocytes Absolute: 1.3 10*3/uL — ABNORMAL HIGH (ref 0.1–1.0)
Monocytes Relative: 10 %
Neutro Abs: 9.7 10*3/uL — ABNORMAL HIGH (ref 1.7–7.7)
Neutrophils Relative %: 72 %
Platelets: 256 10*3/uL (ref 150–400)
RBC: 4.72 MIL/uL (ref 4.22–5.81)
RDW: 13.1 % (ref 11.5–15.5)
WBC: 13.4 10*3/uL — ABNORMAL HIGH (ref 4.0–10.5)
nRBC: 0 % (ref 0.0–0.2)

## 2020-05-21 LAB — URINALYSIS, ROUTINE W REFLEX MICROSCOPIC
Bacteria, UA: NONE SEEN
Bilirubin Urine: NEGATIVE
Glucose, UA: NEGATIVE mg/dL
Hgb urine dipstick: NEGATIVE
Ketones, ur: NEGATIVE mg/dL
Leukocytes,Ua: NEGATIVE
Nitrite: NEGATIVE
Protein, ur: NEGATIVE mg/dL
Specific Gravity, Urine: 1.01 (ref 1.005–1.030)
pH: 7 (ref 5.0–8.0)

## 2020-05-21 LAB — COMPREHENSIVE METABOLIC PANEL
ALT: 29 U/L (ref 0–44)
AST: 21 U/L (ref 15–41)
Albumin: 2.9 g/dL — ABNORMAL LOW (ref 3.5–5.0)
Alkaline Phosphatase: 58 U/L (ref 38–126)
Anion gap: 10 (ref 5–15)
BUN: 14 mg/dL (ref 8–23)
CO2: 25 mmol/L (ref 22–32)
Calcium: 9.1 mg/dL (ref 8.9–10.3)
Chloride: 104 mmol/L (ref 98–111)
Creatinine, Ser: 1.14 mg/dL (ref 0.61–1.24)
GFR calc Af Amer: >60 mL/min (ref >60)
GFR calc non Af Amer: >60 mL/min (ref >60)
Glucose, Bld: 90 mg/dL (ref 70–99)
Potassium: 4.5 mmol/L (ref 3.5–5.1)
Sodium: 139 mmol/L (ref 135–145)
Total Bilirubin: 1.8 mg/dL — ABNORMAL HIGH (ref 0.3–1.2)
Total Protein: 6.6 g/dL (ref 6.5–8.1)

## 2020-05-21 MED ORDER — MAGNESIUM CITRATE PO SOLN
1.0000 | Freq: Once | ORAL | Status: AC
  Administered 2020-05-21: 1 via ORAL
  Filled 2020-05-21: qty 296

## 2020-05-21 NOTE — ED Provider Notes (Signed)
[facility] EMERGENCY DEPARTMENT Provider Note   CSN: [id number] Arrival date & time: 05/21/20  5329     History Chief Complaint  Patient presents with  . Constipation  . Numbness    in back area    Luke Schneider is a 64 y.o. male.  HPI He presents for evaluation of ongoing symptoms, associated with COVID-19 infection.  He tested positive on 05/08/2020.  He was hospitalized on 05/11/2020 for 2 days, requiring oxygen therapy.  He has received a remdesivir infusion series of 4 doses, and Decadron.  He is having ongoing problems with low blood pressure, decreased bowel movements, shortness of breath.  He normally takes valsartan with hydrochlorothiazide for high blood pressure.  He has not been taking this because his blood pressure is low.  Initially he had decreased appetite but has been eating well for 3 days.  Last bowel movement was about 2 weeks ago.  He has a sensation of numbness around his anus and into the legs.  He feels full in his rectal area.  He denies nausea, vomiting, fever, chills, shortness of breath that is ongoing or worsening.  There are no other known modifying factors.    Past Medical History:  Diagnosis Date  . Detached retina, left   . Hyperlipidemia   . Hypertension     Patient Active Problem List   Diagnosis Date Noted  . Acute respiratory failure due to COVID-19 ([place]) 05/11/2020  . Acute kidney injury superimposed on CKD ([place])   . Actinic keratosis 12/29/2019  . Adenomatous colon polyp 12/29/2019  . Hyperlipidemia 12/29/2019  . Hypertension 12/29/2019  . Meniere's disease 12/29/2019  . Neck pain 12/29/2019    History reviewed. No pertinent surgical history.     No family history on file.  Social History   Tobacco Use  . Smoking status: Never Smoker  . Smokeless tobacco: Never Used  Substance Use Topics  . Alcohol use: Not Currently  . Drug use: Not Currently    Home Medications Prior to Admission medications     Medication Sig Start Date End Date Taking? Authorizing Provider  albuterol (VENTOLIN HFA) 108 (90 Base) MCG/ACT inhaler Inhale 2 puffs into the lungs every 6 (six) hours as needed for wheezing or shortness of breath. 05/12/20   Loletha Grayer, MD  ascorbic acid (VITAMIN C) 500 MG tablet Take 1 tablet (500 mg total) by mouth daily. 05/13/20   Loletha Grayer, MD  cholecalciferol (VITAMIN D) 25 MCG tablet Take 1 tablet (1,000 Units total) by mouth daily. 05/13/20   Loletha Grayer, MD  dexamethasone (DECADRON) 6 MG tablet Take 1 tablet (6 mg total) by mouth daily. 05/13/20   Loletha Grayer, MD  simvastatin (ZOCOR) 20 MG tablet TAKE 1 TABLET BY MOUTH EVERY DAY AT NIGHT 09/13/15   [provider]  zinc sulfate 220 (50 Zn) MG capsule Take 1 capsule (220 mg total) by mouth daily. 05/13/20   Loletha Grayer, MD    Allergies    Patient has no known allergies.  Review of Systems   Review of Systems  All other systems reviewed and are negative.   Physical Exam Updated Vital Signs BP (!) 142/87 (BP Location: Right Arm)   Pulse 64   Temp 99 F (37.2 C) (Oral)   Resp 18   Ht 6\' 1"  (1.854 m)   Wt 108.9 kg   SpO2 97%   BMI 31.66 kg/m   Physical Exam Vitals and nursing note reviewed.  Constitutional:  General: He is not in acute distress.    Appearance: He is well-developed. He is obese. He is not ill-appearing, toxic-appearing or diaphoretic.  HENT:     Head: Normocephalic and atraumatic.     Right Ear: External ear normal.     Left Ear: External ear normal.  Eyes:     Conjunctiva/sclera: Conjunctivae normal.     Pupils: Pupils are equal, round, and reactive to light.  Neck:     Trachea: Phonation normal.  Cardiovascular:     Rate and Rhythm: Normal rate and regular rhythm.     Heart sounds: Normal heart sounds.  Pulmonary:     Effort: Pulmonary effort is normal.     Breath sounds: Normal breath sounds.  Abdominal:     Palpations: Abdomen is soft.     Tenderness: There  is no abdominal tenderness.  Genitourinary:    Penis: Normal.      Comments: Anus with normal tone.  Increased stool in rectal vault without frank impaction. Musculoskeletal:        General: Normal range of motion.     Cervical back: Normal range of motion and neck supple.  Skin:    General: Skin is warm and dry.  Neurological:     Mental Status: He is alert and oriented to person, place, and time.     Cranial Nerves: No cranial nerve deficit.     Sensory: No sensory deficit.     Motor: No abnormal muscle tone.     Coordination: Coordination normal.  Psychiatric:        Behavior: Behavior normal.        Thought Content: Thought content normal.        Judgment: Judgment normal.     ED Results / Procedures / Treatments   Labs (all labs ordered are listed, but only abnormal results are displayed) Labs Reviewed  COMPREHENSIVE METABOLIC PANEL - Abnormal; Notable for the following components:      Result Value   Albumin 2.9 (*)    Total Bilirubin 1.8 (*)    All other components within normal limits  CBC WITH DIFFERENTIAL/PLATELET - Abnormal; Notable for the following components:   WBC 13.4 (*)    Neutro Abs 9.7 (*)    Monocytes Absolute 1.3 (*)    Abs Immature Granulocytes 0.39 (*)    All other components within normal limits  LACTIC ACID, PLASMA  URINALYSIS, ROUTINE W REFLEX MICROSCOPIC  LACTIC ACID, PLASMA    EKG None  Radiology No results found.  Procedures Procedures (including critical care time)  Medications Ordered in ED Medications  magnesium citrate solution 1 Bottle (1 Bottle Oral Given 05/21/20 1828)    ED Course  I have reviewed the triage vital signs and the nursing notes.  Pertinent labs & imaging results that were available during my care of the patient were reviewed by me and considered in my medical decision making (see chart for details).  Clinical Course as of May 21 1950  Sat May 21, 2020  1719 Normal except albumin low, total bilirubin high   Comprehensive metabolic panel(!) [EW]  7591 Normal  Lactic acid, plasma [EW]  1719 Normal except white count high  CBC with Differential(!) [EW]    Clinical Course User Index [EW] Daleen Bo, MD   MDM Rules/Calculators/A&P                           Patient Vitals for the past 24 hrs:  BP Temp Temp src Pulse Resp SpO2 Height Weight  05/21/20 1721 (!) 142/87 -- -- 64 18 97 % -- --  05/21/20 1339 119/80 99 F (37.2 C) Oral 72 16 97 % -- --  05/21/20 1030 125/78 98.7 F (37.1 C) Oral 78 17 96 % -- --  05/21/20 1027 -- -- -- -- -- -- 6\' 1"  (1.854 m) 108.9 kg    7:47 PM Reevaluation with update and discussion. After initial assessment and treatment, an updated evaluation reveals he has had multiple stools, and feels better at this time.  5 discussed with patient and wife, all questions were answered. Daleen Bo   Medical Decision Making:  This patient is presenting for evaluation of constipation and malaise, which does require a range of treatment options, and is a complaint that involves a moderate risk of morbidity and mortality. The differential diagnoses include constipation, intestinal obstruction, undernourishment. I decided to review old records, and in summary healthy patient, recovering from Covid, with stooling disorder, nonspecific.  I I did not require additional historical information from anyone.  Clinical Laboratory Tests Ordered, included CBC, Metabolic panel and Urinalysis. Review indicates normal except increased WBC.  Critical Interventions-clinical evaluation, laboratory testing, treatment with antibiotic, and magnesium citrate, observation reassessment  After These Interventions, the Patient was reevaluated and was found to be stooling freely, and feeling better.  He is stable for discharge.  No evidence of complications from current ongoing Covid infection.  No indication for operative intervention, or hospitalization.  Luke Schneider was evaluated in  Emergency Department on 05/21/2020 for the symptoms described in the history of present illness. He was evaluated in the context of the global COVID-19 pandemic, which necessitated consideration that the patient might be at risk for infection with the SARS-CoV-2 virus that causes COVID-19. Institutional protocols and algorithms that pertain to the evaluation of patients at risk for COVID-19 are in a state of rapid change based on information released by regulatory bodies including the CDC and federal and state organizations. These policies and algorithms were followed during the patient's care in the ED.   CRITICAL CARE-no Performed by: Daleen Bo  Nursing Notes Reviewed/ Care Coordinated Applicable Imaging Reviewed Interpretation of Laboratory Data incorporated into ED treatment  The patient appears reasonably screened and/or stabilized for discharge and I doubt any other medical condition or other [facility] requiring further screening, evaluation, or treatment in the ED at this time prior to discharge.  Plan: Home Medications-continue current, use MiraLAX 2 or 3 times a day until stooling well, then once a day for 5 to 7 days; Home Treatments-increase fiber in diet; return here if the recommended treatment, does not improve the symptoms; Recommended follow up-PCP, as needed     Final Clinical Impression(s) / ED Diagnoses Final diagnoses:  Constipation, unspecified constipation type    Rx / DC Orders ED Discharge Orders    None       Daleen Bo, MD 05/21/20 1954

## 2020-05-21 NOTE — Discharge Instructions (Addendum)
To treat the constipation use MiraLAX, 2 or 3 times a day and increase amount of fiber in your diet.  After you began to have bowel movements, continue the MiraLAX, once a day for 5 to 7 days.  See your doctor or return here as needed for problems.

## 2020-05-21 NOTE — ED Triage Notes (Signed)
Pt. Stated, Ive had COVID for 2 weeks and feel numb from my lower back down. Ive not had a BM in 2 weeks.

## 2020-05-21 NOTE — ED Notes (Signed)
Pt given soap suds enema at bedside with assistance from [place], Therapist, sports. Patient verbalized understanding and use of enema and mag citrate.

## 2020-05-21 NOTE — ED Notes (Signed)
Pt to restroom at this time.

## 2020-06-14 DIAGNOSIS — Z1283 Encounter for screening for malignant neoplasm of skin: Secondary | ICD-10-CM | POA: Diagnosis not present

## 2020-06-14 DIAGNOSIS — X32XXXD Exposure to sunlight, subsequent encounter: Secondary | ICD-10-CM | POA: Diagnosis not present

## 2020-06-14 DIAGNOSIS — L570 Actinic keratosis: Secondary | ICD-10-CM | POA: Diagnosis not present

## 2020-06-14 DIAGNOSIS — D225 Melanocytic nevi of trunk: Secondary | ICD-10-CM | POA: Diagnosis not present

## 2020-06-28 DIAGNOSIS — R0602 Shortness of breath: Secondary | ICD-10-CM | POA: Diagnosis not present

## 2020-06-28 DIAGNOSIS — I10 Essential (primary) hypertension: Secondary | ICD-10-CM | POA: Diagnosis not present

## 2020-07-25 DIAGNOSIS — I10 Essential (primary) hypertension: Secondary | ICD-10-CM | POA: Diagnosis not present

## 2020-08-09 DIAGNOSIS — E785 Hyperlipidemia, unspecified: Secondary | ICD-10-CM | POA: Diagnosis not present

## 2020-08-09 DIAGNOSIS — Z23 Encounter for immunization: Secondary | ICD-10-CM | POA: Diagnosis not present

## 2020-08-09 DIAGNOSIS — Z125 Encounter for screening for malignant neoplasm of prostate: Secondary | ICD-10-CM | POA: Diagnosis not present

## 2020-08-09 DIAGNOSIS — I10 Essential (primary) hypertension: Secondary | ICD-10-CM | POA: Diagnosis not present

## 2020-08-09 DIAGNOSIS — Z0000 Encounter for general adult medical examination without abnormal findings: Secondary | ICD-10-CM | POA: Diagnosis not present

## 2020-11-11 DIAGNOSIS — Z8371 Family history of colonic polyps: Secondary | ICD-10-CM | POA: Diagnosis not present

## 2020-11-11 DIAGNOSIS — Z86010 Personal history of colonic polyps: Secondary | ICD-10-CM | POA: Diagnosis not present

## 2020-11-11 DIAGNOSIS — Z8616 Personal history of COVID-19: Secondary | ICD-10-CM | POA: Diagnosis not present

## 2020-12-13 DIAGNOSIS — X32XXXD Exposure to sunlight, subsequent encounter: Secondary | ICD-10-CM | POA: Diagnosis not present

## 2020-12-13 DIAGNOSIS — L570 Actinic keratosis: Secondary | ICD-10-CM | POA: Diagnosis not present

## 2020-12-16 DIAGNOSIS — Z01812 Encounter for preprocedural laboratory examination: Secondary | ICD-10-CM | POA: Diagnosis not present

## 2020-12-16 DIAGNOSIS — Z20822 Contact with and (suspected) exposure to covid-19: Secondary | ICD-10-CM | POA: Diagnosis not present

## 2020-12-20 DIAGNOSIS — Z86010 Personal history of colonic polyps: Secondary | ICD-10-CM | POA: Diagnosis not present

## 2021-02-13 ENCOUNTER — Encounter: Payer: Self-pay | Admitting: Surgery

## 2021-02-13 ENCOUNTER — Emergency Department: Payer: Medicare HMO

## 2021-02-13 ENCOUNTER — Encounter
Admission: EM | Disposition: A | Discharge status: Home / Self Care | Payer: Self-pay | Source: Home / Self Care | Attending: Emergency Medicine

## 2021-02-13 ENCOUNTER — Observation Stay
Admission: EM | Admit: 2021-02-13 | Discharge: 2021-02-14 | Disposition: A | Discharge status: Home / Self Care | Payer: Medicare HMO | Attending: Surgery | Admitting: Surgery

## 2021-02-13 ENCOUNTER — Observation Stay: Payer: Medicare HMO | Admitting: Anesthesiology

## 2021-02-13 ENCOUNTER — Other Ambulatory Visit: Payer: Self-pay

## 2021-02-13 ENCOUNTER — Observation Stay: Payer: Medicare HMO

## 2021-02-13 DIAGNOSIS — I700 Atherosclerosis of aorta: Secondary | ICD-10-CM | POA: Diagnosis not present

## 2021-02-13 DIAGNOSIS — N189 Chronic kidney disease, unspecified: Secondary | ICD-10-CM | POA: Diagnosis not present

## 2021-02-13 DIAGNOSIS — K760 Fatty (change of) liver, not elsewhere classified: Secondary | ICD-10-CM | POA: Diagnosis not present

## 2021-02-13 DIAGNOSIS — N179 Acute kidney failure, unspecified: Secondary | ICD-10-CM | POA: Diagnosis not present

## 2021-02-13 DIAGNOSIS — K5730 Diverticulosis of large intestine without perforation or abscess without bleeding: Secondary | ICD-10-CM | POA: Diagnosis not present

## 2021-02-13 DIAGNOSIS — K8050 Calculus of bile duct without cholangitis or cholecystitis without obstruction: Secondary | ICD-10-CM | POA: Diagnosis present

## 2021-02-13 DIAGNOSIS — Z79899 Other long term (current) drug therapy: Secondary | ICD-10-CM | POA: Diagnosis not present

## 2021-02-13 DIAGNOSIS — K8000 Calculus of gallbladder with acute cholecystitis without obstruction: Secondary | ICD-10-CM | POA: Diagnosis not present

## 2021-02-13 DIAGNOSIS — K8020 Calculus of gallbladder without cholecystitis without obstruction: Secondary | ICD-10-CM

## 2021-02-13 DIAGNOSIS — I129 Hypertensive chronic kidney disease with stage 1 through stage 4 chronic kidney disease, or unspecified chronic kidney disease: Secondary | ICD-10-CM | POA: Diagnosis not present

## 2021-02-13 DIAGNOSIS — K82A1 Gangrene of gallbladder in cholecystitis: Secondary | ICD-10-CM | POA: Diagnosis not present

## 2021-02-13 DIAGNOSIS — R52 Pain, unspecified: Secondary | ICD-10-CM

## 2021-02-13 DIAGNOSIS — K828 Other specified diseases of gallbladder: Secondary | ICD-10-CM | POA: Diagnosis not present

## 2021-02-13 DIAGNOSIS — Z20822 Contact with and (suspected) exposure to covid-19: Secondary | ICD-10-CM | POA: Insufficient documentation

## 2021-02-13 DIAGNOSIS — R109 Unspecified abdominal pain: Secondary | ICD-10-CM | POA: Diagnosis not present

## 2021-02-13 DIAGNOSIS — R1011 Right upper quadrant pain: Secondary | ICD-10-CM | POA: Diagnosis not present

## 2021-02-13 DIAGNOSIS — K8012 Calculus of gallbladder with acute and chronic cholecystitis without obstruction: Secondary | ICD-10-CM | POA: Diagnosis not present

## 2021-02-13 DIAGNOSIS — R001 Bradycardia, unspecified: Secondary | ICD-10-CM | POA: Diagnosis not present

## 2021-02-13 LAB — COMPREHENSIVE METABOLIC PANEL
ALT: 25 U/L (ref 0–44)
AST: 33 U/L (ref 15–41)
Albumin: 4.5 g/dL (ref 3.5–5.0)
Alkaline Phosphatase: 62 U/L (ref 38–126)
Anion gap: 10 (ref 5–15)
BUN: 16 mg/dL (ref 8–23)
CO2: 26 mmol/L (ref 22–32)
Calcium: 9.7 mg/dL (ref 8.9–10.3)
Chloride: 103 mmol/L (ref 98–111)
Creatinine, Ser: 1.07 mg/dL (ref 0.61–1.24)
GFR, Estimated: >60 mL/min (ref >60)
Glucose, Bld: 151 mg/dL — ABNORMAL HIGH (ref 70–99)
Potassium: 4.3 mmol/L (ref 3.5–5.1)
Sodium: 139 mmol/L (ref 135–145)
Total Bilirubin: 2.5 mg/dL — ABNORMAL HIGH (ref 0.3–1.2)
Total Protein: 7.8 g/dL (ref 6.5–8.1)

## 2021-02-13 LAB — CBC
HCT: 44.7 % (ref 39.0–52.0)
Hemoglobin: 16 g/dL (ref 13.0–17.0)
MCH: 32.5 pg (ref 26.0–34.0)
MCHC: 35.8 g/dL (ref 30.0–36.0)
MCV: 90.7 fL (ref 80.0–100.0)
Platelets: 274 10*3/uL (ref 150–400)
RBC: 4.93 MIL/uL (ref 4.22–5.81)
RDW: 12.8 % (ref 11.5–15.5)
WBC: 11.6 10*3/uL — ABNORMAL HIGH (ref 4.0–10.5)
nRBC: 0 % (ref 0.0–0.2)

## 2021-02-13 LAB — RESP PANEL BY RT-PCR (FLU A&B, COVID) ARPGX2
Influenza A by PCR: NEGATIVE
Influenza B by PCR: NEGATIVE
SARS Coronavirus 2 by RT PCR: NEGATIVE

## 2021-02-13 LAB — TROPONIN I (HIGH SENSITIVITY)
Troponin I (High Sensitivity): 6 ng/L (ref <18)
Troponin I (High Sensitivity): 8 ng/L (ref <18)

## 2021-02-13 LAB — LIPASE, BLOOD: Lipase: 39 U/L (ref 11–51)

## 2021-02-13 SURGERY — CHOLECYSTECTOMY, ROBOT-ASSISTED, LAPAROSCOPIC
Anesthesia: General | Site: Abdomen

## 2021-02-13 MED ORDER — LIDOCAINE-EPINEPHRINE (PF) 1 %-1:200000 IJ SOLN
INTRAMUSCULAR | Status: DC | PRN
  Administered 2021-02-13: 20 mL via INTRAMUSCULAR

## 2021-02-13 MED ORDER — FENTANYL CITRATE (PF) 100 MCG/2ML IJ SOLN
INTRAMUSCULAR | Status: AC
  Filled 2021-02-13: qty 2

## 2021-02-13 MED ORDER — ACETAMINOPHEN 10 MG/ML IV SOLN
INTRAVENOUS | Status: AC
  Filled 2021-02-13: qty 100

## 2021-02-13 MED ORDER — INDOCYANINE GREEN 25 MG IV SOLR
1.2500 mg | Freq: Once | INTRAVENOUS | Status: AC
  Administered 2021-02-13: 1.25 mg via INTRAVENOUS
  Filled 2021-02-13: qty 0.5

## 2021-02-13 MED ORDER — VALSARTAN-HYDROCHLOROTHIAZIDE 160-12.5 MG PO TABS: 1.0000 | ORAL_TABLET | Freq: Every day | ORAL | Status: DC

## 2021-02-13 MED ORDER — LIDOCAINE HCL (CARDIAC) PF 100 MG/5ML IV SOSY
PREFILLED_SYRINGE | INTRAVENOUS | Status: DC | PRN
  Administered 2021-02-13: 100 mg via INTRAVENOUS

## 2021-02-13 MED ORDER — KETOROLAC TROMETHAMINE 30 MG/ML IJ SOLN
15.0000 mg | Freq: Once | INTRAMUSCULAR | Status: AC
  Administered 2021-02-13: 15 mg via INTRAVENOUS
  Filled 2021-02-13: qty 1

## 2021-02-13 MED ORDER — SUGAMMADEX SODIUM 200 MG/2ML IV SOLN
INTRAVENOUS | Status: DC | PRN
  Administered 2021-02-13: 200 mg via INTRAVENOUS

## 2021-02-13 MED ORDER — HYDROMORPHONE HCL 1 MG/ML IJ SOLN
INTRAMUSCULAR | Status: AC
  Filled 2021-02-13: qty 1

## 2021-02-13 MED ORDER — HEMOSTATIC AGENTS (NO CHARGE) OPTIME
TOPICAL | Status: DC | PRN
  Administered 2021-02-13: 1 via TOPICAL

## 2021-02-13 MED ORDER — HYDROMORPHONE HCL 1 MG/ML IJ SOLN
INTRAMUSCULAR | Status: DC | PRN
  Administered 2021-02-13: 1 mg via INTRAVENOUS

## 2021-02-13 MED ORDER — ONDANSETRON HCL 4 MG/2ML IJ SOLN: 4.0000 mg | Freq: Once | INTRAMUSCULAR | Status: DC | PRN

## 2021-02-13 MED ORDER — TRAMADOL HCL 50 MG PO TABS: 50.0000 mg | ORAL_TABLET | Freq: Four times a day (QID) | ORAL | Status: DC | PRN

## 2021-02-13 MED ORDER — MIDAZOLAM HCL 2 MG/2ML IJ SOLN
INTRAMUSCULAR | Status: AC
  Filled 2021-02-13: qty 2

## 2021-02-13 MED ORDER — GADOBUTROL 1 MMOL/ML IV SOLN
10.0000 mL | Freq: Once | INTRAVENOUS | Status: AC | PRN
  Administered 2021-02-13: 10 mL via INTRAVENOUS

## 2021-02-13 MED ORDER — DEXAMETHASONE SODIUM PHOSPHATE 10 MG/ML IJ SOLN
INTRAMUSCULAR | Status: DC | PRN
  Administered 2021-02-13: 10 mg via INTRAVENOUS

## 2021-02-13 MED ORDER — ENOXAPARIN SODIUM 40 MG/0.4ML IJ SOSY: 40.0000 mg | PREFILLED_SYRINGE | INTRAMUSCULAR | Status: DC

## 2021-02-13 MED ORDER — ACETAMINOPHEN 10 MG/ML IV SOLN
INTRAVENOUS | Status: DC | PRN
  Administered 2021-02-13: 1000 mg via INTRAVENOUS

## 2021-02-13 MED ORDER — MIDAZOLAM HCL 2 MG/2ML IJ SOLN
INTRAMUSCULAR | Status: DC | PRN
  Administered 2021-02-13: 2 mg via INTRAVENOUS

## 2021-02-13 MED ORDER — ROCURONIUM BROMIDE 100 MG/10ML IV SOLN
INTRAVENOUS | Status: DC | PRN
  Administered 2021-02-13: 50 mg via INTRAVENOUS
  Administered 2021-02-13: 20 mg via INTRAVENOUS
  Administered 2021-02-13: 30 mg via INTRAVENOUS
  Administered 2021-02-13: 20 mg via INTRAVENOUS

## 2021-02-13 MED ORDER — LIDOCAINE HCL (PF) 1 % IJ SOLN
INTRAMUSCULAR | Status: AC
  Filled 2021-02-13: qty 30

## 2021-02-13 MED ORDER — HYDROCHLOROTHIAZIDE 12.5 MG PO CAPS
12.5000 mg | ORAL_CAPSULE | Freq: Every day | ORAL | Status: DC
  Administered 2021-02-14: 12.5 mg via ORAL
  Filled 2021-02-13: qty 1

## 2021-02-13 MED ORDER — SODIUM CHLORIDE 0.9 % IV SOLN: INTRAVENOUS | Status: DC

## 2021-02-13 MED ORDER — PROPOFOL 10 MG/ML IV BOLUS
INTRAVENOUS | Status: DC | PRN
  Administered 2021-02-13: 180 mg via INTRAVENOUS

## 2021-02-13 MED ORDER — FENTANYL CITRATE (PF) 100 MCG/2ML IJ SOLN
50.0000 ug | Freq: Once | INTRAMUSCULAR | Status: AC
  Administered 2021-02-13: 50 ug via INTRAVENOUS
  Filled 2021-02-13: qty 2

## 2021-02-13 MED ORDER — HYDROCODONE-ACETAMINOPHEN 5-325 MG PO TABS
1.0000 | ORAL_TABLET | ORAL | Status: DC | PRN
Start: 2021-02-13 — End: 2021-02-14
  Administered 2021-02-13 – 2021-02-14 (×2): 1 via ORAL
  Filled 2021-02-13 (×2): qty 1

## 2021-02-13 MED ORDER — ONDANSETRON HCL 4 MG/2ML IJ SOLN: 4.0000 mg | Freq: Four times a day (QID) | INTRAMUSCULAR | Status: DC | PRN

## 2021-02-13 MED ORDER — LACTATED RINGERS IV BOLUS
1000.0000 mL | Freq: Once | INTRAVENOUS | Status: AC
  Administered 2021-02-13: 1000 mL via INTRAVENOUS

## 2021-02-13 MED ORDER — PROPOFOL 10 MG/ML IV BOLUS
INTRAVENOUS | Status: AC
  Filled 2021-02-13: qty 20

## 2021-02-13 MED ORDER — FENTANYL CITRATE (PF) 100 MCG/2ML IJ SOLN
25.0000 ug | INTRAMUSCULAR | Status: DC | PRN
  Administered 2021-02-13 (×2): 25 ug via INTRAVENOUS

## 2021-02-13 MED ORDER — BUPIVACAINE HCL (PF) 0.5 % IJ SOLN
INTRAMUSCULAR | Status: AC
  Filled 2021-02-13: qty 30

## 2021-02-13 MED ORDER — PHENYLEPHRINE HCL (PRESSORS) 10 MG/ML IV SOLN
INTRAVENOUS | Status: DC | PRN
  Administered 2021-02-13 (×3): 100 ug via INTRAVENOUS

## 2021-02-13 MED ORDER — MORPHINE SULFATE (PF) 2 MG/ML IV SOLN: 2.0000 mg | INTRAVENOUS | Status: DC | PRN

## 2021-02-13 MED ORDER — SODIUM CHLORIDE 0.9 % IV SOLN
2.0000 g | INTRAVENOUS | Status: DC
  Administered 2021-02-13 – 2021-02-14 (×2): 2 g via INTRAVENOUS
  Filled 2021-02-13: qty 20
  Filled 2021-02-13: qty 2

## 2021-02-13 MED ORDER — LACTATED RINGERS IV SOLN: INTRAVENOUS | Status: DC | PRN

## 2021-02-13 MED ORDER — EPINEPHRINE PF 1 MG/ML IJ SOLN
INTRAMUSCULAR | Status: AC
  Filled 2021-02-13: qty 1

## 2021-02-13 MED ORDER — DOCUSATE SODIUM 100 MG PO CAPS: 100.0000 mg | ORAL_CAPSULE | Freq: Two times a day (BID) | ORAL | Status: DC | PRN

## 2021-02-13 MED ORDER — IRBESARTAN 150 MG PO TABS
150.0000 mg | ORAL_TABLET | Freq: Every day | ORAL | Status: DC
  Administered 2021-02-14: 150 mg via ORAL
  Filled 2021-02-13: qty 1

## 2021-02-13 MED ORDER — ONDANSETRON HCL 4 MG/2ML IJ SOLN
4.0000 mg | Freq: Once | INTRAMUSCULAR | Status: AC
  Administered 2021-02-13: 4 mg via INTRAVENOUS
  Filled 2021-02-13: qty 2

## 2021-02-13 MED ORDER — FENTANYL CITRATE (PF) 100 MCG/2ML IJ SOLN
INTRAMUSCULAR | Status: DC | PRN
  Administered 2021-02-13 (×2): 50 ug via INTRAVENOUS

## 2021-02-13 MED ORDER — ONDANSETRON HCL 4 MG/2ML IJ SOLN
INTRAMUSCULAR | Status: DC | PRN
  Administered 2021-02-13 (×2): 4 mg via INTRAVENOUS

## 2021-02-13 MED ORDER — ONDANSETRON 4 MG PO TBDP
4.0000 mg | ORAL_TABLET | Freq: Four times a day (QID) | ORAL | Status: DC | PRN
Start: 2021-02-13 — End: 2021-02-14

## 2021-02-13 SURGICAL SUPPLY — 60 items
ANCHOR TIS RET SYS 235ML (MISCELLANEOUS) ×2 IMPLANT
BAG INFUSER PRESSURE 100CC (MISCELLANEOUS) ×2 IMPLANT
BLADE SURG SZ11 CARB STEEL (BLADE) ×2 IMPLANT
BULB RESERV EVAC DRAIN JP 100C (MISCELLANEOUS) ×2 IMPLANT
CANISTER SUCT 1200ML W/VALVE (MISCELLANEOUS) ×2 IMPLANT
CANNULA REDUC XI 12-8 STAPL (CANNULA) ×1
CANNULA REDUCER 12-8 DVNC XI (CANNULA) ×1 IMPLANT
CATH REDDICK CHOLANGI 4FR 50CM (CATHETERS) IMPLANT
CHLORAPREP W/TINT 26 (MISCELLANEOUS) ×2 IMPLANT
CLIP VESOLOCK MED LG 6/CT (CLIP) ×2 IMPLANT
COVER TIP SHEARS 8 DVNC (MISCELLANEOUS) ×1 IMPLANT
COVER TIP SHEARS 8MM DA VINCI (MISCELLANEOUS) ×1
COVER WAND RF STERILE (DRAPES) ×2 IMPLANT
DECANTER SPIKE VIAL GLASS SM (MISCELLANEOUS) ×4 IMPLANT
DEFOGGER SCOPE WARMER CLEARIFY (MISCELLANEOUS) ×2 IMPLANT
DERMABOND ADVANCED (GAUZE/BANDAGES/DRESSINGS) ×1
DERMABOND ADVANCED .7 DNX12 (GAUZE/BANDAGES/DRESSINGS) ×1 IMPLANT
DRAIN CHANNEL JP 15F RND 16 (MISCELLANEOUS) ×2 IMPLANT
DRAPE ARM DVNC X/XI (DISPOSABLE) ×4 IMPLANT
DRAPE C-ARM XRAY 36X54 (DRAPES) IMPLANT
DRAPE COLUMN DVNC XI (DISPOSABLE) ×1 IMPLANT
DRAPE DA VINCI XI ARM (DISPOSABLE) ×4
DRAPE DA VINCI XI COLUMN (DISPOSABLE) ×1
DRSG TEGADERM 4X4.75 (GAUZE/BANDAGES/DRESSINGS) ×2 IMPLANT
ELECT CAUTERY BLADE 6.4 (BLADE) ×2 IMPLANT
ELECT REM PT RETURN 9FT ADLT (ELECTROSURGICAL) ×2
ELECTRODE REM PT RTRN 9FT ADLT (ELECTROSURGICAL) ×1 IMPLANT
GLOVE SURG SYN 6.5 ES PF (GLOVE) ×4 IMPLANT
GLOVE SURG UNDER POLY LF SZ7 (GLOVE) ×4 IMPLANT
GOWN STRL REUS W/ TWL LRG LVL3 (GOWN DISPOSABLE) ×3 IMPLANT
GOWN STRL REUS W/TWL LRG LVL3 (GOWN DISPOSABLE) ×3
GRASPER SUT TROCAR 14GX15 (MISCELLANEOUS) ×2 IMPLANT
HEMOSTAT SURGICEL 2X3 (HEMOSTASIS) ×2 IMPLANT
IRRIGATOR SUCT 8 DISP DVNC XI (IRRIGATION / IRRIGATOR) ×1 IMPLANT
IRRIGATOR SUCTION 8MM XI DISP (IRRIGATION / IRRIGATOR) ×1
IV NS 1000ML (IV SOLUTION) ×1
IV NS 1000ML BAXH (IV SOLUTION) ×1 IMPLANT
LABEL OR SOLS (LABEL) ×2 IMPLANT
MANIFOLD NEPTUNE II (INSTRUMENTS) ×2 IMPLANT
NEEDLE HYPO 22GX1.5 SAFETY (NEEDLE) ×2 IMPLANT
NEEDLE INSUFFLATION 14GA 120MM (NEEDLE) ×2 IMPLANT
NS IRRIG 500ML POUR BTL (IV SOLUTION) ×2 IMPLANT
OBTURATOR OPTICAL STANDARD 8MM (TROCAR) ×1
OBTURATOR OPTICAL STND 8 DVNC (TROCAR) ×1
OBTURATOR OPTICALSTD 8 DVNC (TROCAR) ×1 IMPLANT
PACK LAP CHOLECYSTECTOMY (MISCELLANEOUS) ×2 IMPLANT
PENCIL ELECTRO HAND CTR (MISCELLANEOUS) ×2 IMPLANT
SEAL CANN UNIV 5-8 DVNC XI (MISCELLANEOUS) ×3 IMPLANT
SEAL XI 5MM-8MM UNIVERSAL (MISCELLANEOUS) ×3
SET TUBE SMOKE EVAC HIGH FLOW (TUBING) ×2 IMPLANT
SOLUTION ELECTROLUBE (MISCELLANEOUS) ×2 IMPLANT
STAPLER CANNULA SEAL DVNC XI (STAPLE) ×1 IMPLANT
STAPLER CANNULA SEAL XI (STAPLE) ×1
SUT ETHILON 3-0 FS-10 30 BLK (SUTURE) ×2
SUT MNCRL 4-0 (SUTURE) ×2
SUT MNCRL 4-0 27XMFL (SUTURE) ×2
SUT VICRYL 0 AB UR-6 (SUTURE) ×2 IMPLANT
SUTURE EHLN 3-0 FS-10 30 BLK (SUTURE) ×1 IMPLANT
SUTURE MNCRL 4-0 27XMF (SUTURE) ×2 IMPLANT
SYR 30ML LL (SYRINGE) IMPLANT

## 2021-02-13 NOTE — ED Notes (Signed)
Dr. Lysle Pearl at bedside at this time.

## 2021-02-13 NOTE — Interval H&P Note (Signed)
History and Physical Interval Note:  02/13/2021 3:06 PM  Luke Schneider  has presented today for surgery, with the diagnosis of biliary colic.  The various methods of treatment have been discussed with the patient and family. After consideration of risks, benefits and other options for treatment, the patient has consented to  Procedure(s): XI ROBOTIC [place] (N/A) as a surgical intervention.  The patient's history has been reviewed, patient examined, no change in status, stable for surgery.  I have reviewed the patient's chart and labs.  Questions were answered to the patient's satisfaction.    MRCP negative for CBD stone.  Will proceed as scheduled  Wendal Wilkie Lysle Pearl

## 2021-02-13 NOTE — Anesthesia Preprocedure Evaluation (Signed)
Anesthesia Evaluation  Patient identified by MRN, date of birth, ID band Patient awake    Reviewed: Allergy & Precautions, NPO status , Patient's Chart, lab work & pertinent test results  History of Anesthesia Complications Negative for: history of anesthetic complications  Airway Mallampati: II       Dental   Pulmonary neg sleep apnea, neg COPD, Not current smoker,           Cardiovascular hypertension, Pt. on medications (-) Past MI and (-) CHF (-) dysrhythmias (-) Valvular Problems/Murmurs     Neuro/Psych neg Seizures    GI/Hepatic Neg liver ROS, neg GERD  ,  Endo/Other  neg diabetes  Renal/GU Renal InsufficiencyRenal disease     Musculoskeletal   Abdominal   Peds  Hematology   Anesthesia Other Findings   Reproductive/Obstetrics                             Anesthesia Physical Anesthesia Plan  ASA: II  Anesthesia Plan: General   Post-op Pain Management:    Induction: Intravenous  PONV Risk Score and Plan: 2 and Ondansetron and Dexamethasone  Airway Management Planned: Oral ETT  Additional Equipment:   Intra-op Plan:   Post-operative Plan:   Informed Consent: I have reviewed the patients History and Physical, chart, labs and discussed the procedure including the risks, benefits and alternatives for the proposed anesthesia with the patient or authorized representative who has indicated his/her understanding and acceptance.       Plan Discussed with:   Anesthesia Plan Comments:         Anesthesia Quick Evaluation

## 2021-02-13 NOTE — H&P (Signed)
Subjective:   CC: biliary colic, elevated bili  HPI:  Luke Schneider is a 65 y.o. male who is consulted by Alfred Levins for evaluation of above cc.  Symptoms were first noted 1 day ago. Pain is sharp, located in RUQ, non-radiating.  Started at night.  Associated with N/V, exacerbated by nothing specific.  Couple similar episodes in past that resolved without intervention, but this one severe enough to come to ED     Past Medical History:  has a past medical history of Detached retina, left, Hyperlipidemia, and Hypertension.  Past Surgical History: no abdominal surgeries  Family History: reviewed and not relevant to CC  Social History:  reports that he has never smoked. He has never used smokeless tobacco. He reports previous alcohol use. He reports previous drug use.  Current Medications:  Prior to Admission medications   Medication Sig Start Date End Date Taking? Authorizing Provider  simvastatin (ZOCOR) 20 MG tablet Take 20 mg by mouth at bedtime.   Yes [provider]  valsartan-hydrochlorothiazide (DIOVAN-HCT) 160-12.5 MG tablet Take 1 tablet by mouth daily. 01/12/21  Yes [provider]  albuterol (VENTOLIN HFA) 108 (90 Base) MCG/ACT inhaler Inhale 2 puffs into the lungs every 6 (six) hours as needed for wheezing or shortness of breath. 05/12/20   Loletha Grayer, MD  ascorbic acid (VITAMIN C) 500 MG tablet Take 1 tablet (500 mg total) by mouth daily. 05/13/20   Loletha Grayer, MD  cholecalciferol (VITAMIN D) 25 MCG tablet Take 1 tablet (1,000 Units total) by mouth daily. 05/13/20   Loletha Grayer, MD  dexamethasone (DECADRON) 6 MG tablet Take 1 tablet (6 mg total) by mouth daily. Patient not taking: Reported on 02/13/2021 05/13/20   Loletha Grayer, MD  zinc sulfate 220 (50 Zn) MG capsule Take 1 capsule (220 mg total) by mouth daily. 05/13/20   Loletha Grayer, MD    Allergies:  Allergies as of 02/13/2021  . (No Known Allergies)    ROS:  General: Denies weight  loss, weight gain, fatigue, fevers, chills, and night sweats. Eyes: Denies blurry vision, double vision, eye pain, itchy eyes, and tearing. Ears: Denies hearing loss, earache, and ringing in ears. Nose: Denies sinus pain, congestion, infections, runny nose, and nosebleeds. Mouth/throat: Denies hoarseness, sore throat, bleeding gums, and difficulty swallowing. Heart: Denies chest pain, palpitations, racing heart, irregular heartbeat, leg pain or swelling, and decreased activity tolerance. Respiratory: Denies breathing difficulty, shortness of breath, wheezing, cough, and sputum. GI: Denies change in appetite, heartburn, constipation, diarrhea, and blood in stool. GU: Denies difficulty urinating, pain with urinating, urgency, frequency, blood in urine. Musculoskeletal: Denies joint stiffness, pain, swelling, muscle weakness. Skin: Denies rash, itching, mass, tumors, sores, and boils Neurologic: Denies headache, fainting, dizziness, seizures, numbness, and tingling. Psychiatric: Denies depression, anxiety, difficulty sleeping, and memory loss. Endocrine: Denies heat or cold intolerance, and increased thirst or urination. Blood/lymph: Denies easy bruising, and swollen glands     Objective:     BP 137/76   Pulse 73   Temp 98.4 F (36.9 C) (Oral)   Resp 20   Ht 6\' 1"  (1.854 m)   Wt 104.3 kg   SpO2 98%   BMI 30.34 kg/m    Constitutional :  alert, cooperative, appears stated age and no distress  Lymphatics/Throat:  no asymmetry, masses, or scars  Respiratory:  clear to auscultation bilaterally  Cardiovascular:  regular rate and rhythm  Gastrointestinal: soft, TTP in RUQ.   Musculoskeletal: Steady movement  Skin: Cool and moist, no  surgical scars  Psychiatric: Normal affect, non-agitated, not confused       LABS:  CMP Latest Ref Rng & Units 02/13/2021 05/21/2020 05/12/2020  Glucose 70 - 99 mg/dL 151(H) 90 149(H)  BUN 8 - 23 mg/dL 16 14 29(H)  Creatinine 0.61 - 1.24 mg/dL 1.07 1.14  1.27(H)  Sodium 135 - 145 mmol/L 139 139 136  Potassium 3.5 - 5.1 mmol/L 4.3 4.5 4.2  Chloride 98 - 111 mmol/L 103 104 103  CO2 22 - 32 mmol/L 26 25 24   Calcium 8.9 - 10.3 mg/dL 9.7 9.1 8.0(L)  Total Protein 6.5 - 8.1 g/dL 7.8 6.6 6.5  Total Bilirubin 0.3 - 1.2 mg/dL 2.5(H) 1.8(H) 1.1  Alkaline Phos 38 - 126 U/L 62 58 54  AST 15 - 41 U/L 33 21 43(H)  ALT 0 - 44 U/L 25 29 33   CBC Latest Ref Rng & Units 02/13/2021 05/21/2020 05/12/2020  WBC 4.0 - 10.5 K/uL 11.6(H) 13.4(H) 4.6  Hemoglobin 13.0 - 17.0 g/dL 16.0 14.8 14.7  Hematocrit 39.0 - 52.0 % 44.7 43.9 41.9  Platelets 150 - 400 K/uL 274 256 169     RADS: CLINICAL DATA:  Nonlocalized acute abdominal pain.  EXAM: CT ABDOMEN AND PELVIS WITHOUT CONTRAST  TECHNIQUE: Multidetector CT imaging of the abdomen and pelvis was performed following the standard protocol without IV contrast.  COMPARISON:  Ultrasound abdomen 02/13/2021.  FINDINGS: Lower chest: Bibasilar atelectasis. Bilateral lower lobe mild bronchiectasis and reticulations. Coronary artery calcifications.  Hepatobiliary: No focal liver abnormality. Multiple subcentimeter calcified gallstones within the gallbladder lumen. No gallbladder wall thickening or pericholecystic fluid. No biliary dilatation.  Pancreas: No focal lesion. Normal pancreatic contour. No surrounding inflammatory changes. No main pancreatic ductal dilatation.  Spleen: Normal in size without focal abnormality.  Adrenals/Urinary Tract:  No adrenal nodule bilaterally.  No nephrolithiasis, no hydronephrosis, and no contour-deforming renal mass. No ureterolithiasis or hydroureter.  The urinary bladder is unremarkable.  Stomach/Bowel: Stomach is within normal limits. No evidence of bowel wall thickening or dilatation. Scattered sigmoid and left colon diverticulosis. The appendix is not definitely identified. No right lower quadrant inflammatory changes.  Vascular/Lymphatic: No  abdominal aorta or iliac aneurysm. Moderate atherosclerotic plaque of the aorta and its branches. No abdominal, pelvic, or inguinal lymphadenopathy.  Reproductive: Prostate is unremarkable.  Other: No intraperitoneal free fluid. No intraperitoneal free gas. No organized fluid collection.  Musculoskeletal:  No abdominal wall hernia or abnormality.  No suspicious lytic or blastic osseous lesions. No acute displaced fracture. Multilevel degenerative changes of the spine.  IMPRESSION: 1. Cholelithiasis with no CT findings of acute cholecystitis or choledocholithiasis. 2. Colonic diverticulosis with no acute diverticulitis. 3. Aortic Atherosclerosis (ICD10-I70.0) including coronary artery calcifications. 4. Mild bilateral lower lobe pulmonary fibrosis.   Electronically Signed   By: Iven Finn M.D.   On: 02/13/2021 06:43  CLINICAL DATA:  66 year old male with right side abdominal pain.  EXAM: ULTRASOUND ABDOMEN LIMITED RIGHT UPPER QUADRANT  COMPARISON:  None.  FINDINGS: Gallbladder:  Multiple shadowing echogenic gallstones, individually estimated at 15 mm diameter (image 6). Superimposed sludge. Elongated gallbladder, about 12.5 cm in length. Gallbladder wall thickness is borderline at 3 mm (image 9). But no sonographic Murphy sign was elicited. No pericholecystic fluid identified.  Common bile duct:  Diameter: 3 mm, normal.  Liver:  Echogenic liver (image 28). No discrete liver lesion or intrahepatic biliary ductal dilatation identified. Portal vein is patent on color Doppler imaging with normal direction of blood flow towards the liver.  Other:  Negative visible right kidney.  IMPRESSION: 1. Gallbladder sludge and stones with borderline wall thickening, but no sonographic Murphy sign to strongly suggest acute cholecystitis. 2. Fatty liver disease.  No evidence of bile duct obstruction.   Electronically Signed   By: Genevie Ann M.D.    On: 02/13/2021 05:51   Assessment:      Biliary colic Elevated t. Bili leukocytosis  Plan:     History consistent with biliary colic, likely acute cholecystitis with the leukocytosis, even though no obvious imaging findings, which were personally reviewed by myself.  Discussed the risk of surgery including post-op infxn, seroma, biloma, chronic pain, poor-delayed wound healing, retained gallstone, conversion to open procedure, post-op SBO or ileus, and need for additional procedures to address said risks.  The risks of general anesthetic including MI, CVA, sudden death or even reaction to anesthetic medications also discussed. Alternatives include continued observation.  Benefits include possible symptom relief, prevention of complications including acute cholecystitis, pancreatitis.  Typical post operative recovery of 3-5 days rest, continued pain in area and incision sites, possible loose stools up to 4-6 weeks, also discussed.  The patient understands the risks, any and all questions were answered to the patient's satisfaction.  Will proceed with robotic assited lap chole after MRCP to confirm no CBD stone.  Abx, IVF, pain control, NPO in the meantime

## 2021-02-13 NOTE — Anesthesia Postprocedure Evaluation (Signed)
Anesthesia Post Note  Patient: Luke Schneider  Procedure(s) Performed: XI ROBOTIC ASSISTED LAPAROSCOPIC CHOLECYSTECTOMY (N/A Abdomen)  Patient location during evaluation: PACU Anesthesia Type: General Level of consciousness: awake and alert Pain management: pain level controlled Vital Signs Assessment: post-procedure vital signs reviewed and stable Respiratory status: spontaneous breathing, nonlabored ventilation, respiratory function stable and patient connected to nasal cannula oxygen Cardiovascular status: blood pressure returned to baseline and stable Postop Assessment: no apparent nausea or vomiting Anesthetic complications: no   No complications documented.   Last Vitals:  Vitals:   02/13/21 1930 02/13/21 1952  BP: (!) 107/56 113/61  Pulse: 73 71  Resp: 13 18  Temp:  37.3 C  SpO2: 95% 91%    Last Pain:  Vitals:   02/13/21 1952  TempSrc: Oral  PainSc:                  Arita Miss

## 2021-02-13 NOTE — Anesthesia Procedure Notes (Addendum)
Procedure Name: Intubation Date/Time: 02/13/2021 4:19 PM Performed by: Nelda Marseille, CRNA Pre-anesthesia Checklist: Patient identified, Patient being monitored, Timeout performed, Emergency Drugs available and Suction available Patient Re-evaluated:Patient Re-evaluated prior to induction Oxygen Delivery Method: Circle system utilized Preoxygenation: Pre-oxygenation with 100% oxygen Induction Type: IV induction Ventilation: Mask ventilation without difficulty Laryngoscope Size: Mac, 3 and McGraph Grade View: Grade I Tube type: Oral Tube size: 7.5 mm Number of attempts: 1 Airway Equipment and Method: Stylet Placement Confirmation: ETT inserted through vocal cords under direct vision,  positive ETCO2 and breath sounds checked- equal and bilateral Secured at: 22 cm Tube secured with: Tape Dental Injury: Teeth and Oropharynx as per pre-operative assessment  Difficulty Due To: Difficulty was unanticipated

## 2021-02-13 NOTE — Transfer of Care (Signed)
Immediate Anesthesia Transfer of Care Note  Patient: Luke Schneider  Procedure(s) Performed: XI ROBOTIC ASSISTED LAPAROSCOPIC CHOLECYSTECTOMY (N/A Abdomen)  Patient Location: PACU  Anesthesia Type:General  Level of Consciousness: sedated  Airway & Oxygen Therapy: Patient Spontanous Breathing and Patient connected to face mask oxygen  Post-op Assessment: Report given to RN and Post -op Vital signs reviewed and stable  Post vital signs: Reviewed and stable  Last Vitals:  Vitals Value Taken Time  BP    Temp    Pulse    Resp    SpO2      Last Pain:  Vitals:   02/13/21 1456  TempSrc: Temporal  PainSc: 3          Complications: No complications documented.

## 2021-02-13 NOTE — ED Provider Notes (Signed)
[facility] Emergency Department Provider Note  ____________________________________________  Time seen: Approximately 3:47 AM  I have reviewed the triage vital signs and the nursing notes.   HISTORY  Chief Complaint Abdominal Pain   HPI Luke Schneider is a 65 y.o. male with a history of hypertension hyperlipidemia who presents for evaluation of abdominal pain.  He reports that the pain started at 9 PM after having a bowl of tomato soup.  The pain is sharp, constant, located in the right upper quadrant epigastric region, severe, associated with nausea and 6 episodes of nonbloody nonbilious emesis.  No prior abdominal surgeries.  No diarrhea, no chest pain or shortness of breath, no fever or chills, no dysuria or hematuria.  Patient reports having the same pain several times in the past but they usually resolved quickly with no intervention.   Past Medical History:  Diagnosis Date  . Detached retina, left   . Hyperlipidemia   . Hypertension     Patient Active Problem List   Diagnosis Date Noted  . Acute respiratory failure due to COVID-19 ([place]) 05/11/2020  . Acute kidney injury superimposed on CKD ([place])   . Actinic keratosis 12/29/2019  . Adenomatous colon polyp 12/29/2019  . Hyperlipidemia 12/29/2019  . Hypertension 12/29/2019  . Meniere's disease 12/29/2019  . Neck pain 12/29/2019    No past surgical history on file.  Prior to Admission medications   Medication Sig Start Date End Date Taking? Authorizing Provider  albuterol (VENTOLIN HFA) 108 (90 Base) MCG/ACT inhaler Inhale 2 puffs into the lungs every 6 (six) hours as needed for wheezing or shortness of breath. 05/12/20   Loletha Grayer, MD  ascorbic acid (VITAMIN C) 500 MG tablet Take 1 tablet (500 mg total) by mouth daily. 05/13/20   Loletha Grayer, MD  cholecalciferol (VITAMIN D) 25 MCG tablet Take 1 tablet (1,000 Units total) by mouth daily. 05/13/20   Loletha Grayer, MD  dexamethasone  (DECADRON) 6 MG tablet Take 1 tablet (6 mg total) by mouth daily. 05/13/20   Loletha Grayer, MD  simvastatin (ZOCOR) 20 MG tablet TAKE 1 TABLET BY MOUTH EVERY DAY AT NIGHT 09/13/15   [provider]  zinc sulfate 220 (50 Zn) MG capsule Take 1 capsule (220 mg total) by mouth daily. 05/13/20   Loletha Grayer, MD    Allergies Patient has no known allergies.  No family history on file.  Social History Social History   Tobacco Use  . Smoking status: Never Smoker  . Smokeless tobacco: Never Used  Substance Use Topics  . Alcohol use: Not Currently  . Drug use: Not Currently    Review of Systems  Constitutional: Negative for fever. Eyes: Negative for visual changes. ENT: Negative for sore throat. Neck: No neck pain  Cardiovascular: Negative for chest pain. Respiratory: Negative for shortness of breath. Gastrointestinal: + abdominal pain, vomiting or diarrhea. Genitourinary: Negative for dysuria. Musculoskeletal: Negative for back pain. Skin: Negative for rash. Neurological: Negative for headaches, weakness or numbness. Psych: No SI or HI  ____________________________________________   PHYSICAL EXAM:  VITAL SIGNS: ED Triage Vitals  Enc Vitals Group     BP 02/13/21 0251 (!) 168/83     Pulse Rate 02/13/21 0251 (!) 48     Resp 02/13/21 0251 16     Temp 02/13/21 0251 98.4 F (36.9 C)     Temp Source 02/13/21 0251 Oral     SpO2 02/13/21 0251 100 %     Weight 02/13/21 0252 230 lb (  104.3 kg)     Height 02/13/21 0252 6\' 1"  (1.854 m)     Head Circumference --      Peak Flow --      Pain Score 02/13/21 0252 10     Pain Loc --      Pain Edu? --      Excl. in [place]? --     Constitutional: Alert and oriented. Well appearing and in no apparent distress. HEENT:      Head: Normocephalic and atraumatic.         Eyes: Conjunctivae are normal. Sclera is non-icteric.       Mouth/Throat: Mucous membranes are moist.       Neck: Supple with no signs of  meningismus. Cardiovascular: Regular rate and rhythm. No murmurs, gallops, or rubs. 2+ symmetrical distal pulses are present in all extremities. No JVD. Respiratory: Normal respiratory effort. Lungs are clear to auscultation bilaterally.  Gastrointestinal: Soft, tender to palpation the right upper quadrant epigastric region with localized guarding and positive Murphy sign. Musculoskeletal:  No edema, cyanosis, or erythema of extremities. Neurologic: Normal speech and language. Face is symmetric. Moving all extremities. No gross focal neurologic deficits are appreciated. Skin: Skin is warm, dry and intact. No rash noted. Psychiatric: Mood and affect are normal. Speech and behavior are normal.  ____________________________________________   LABS (all labs ordered are listed, but only abnormal results are displayed)  Labs Reviewed  CBC - Abnormal; Notable for the following components:      Result Value   WBC 11.6 (*)    All other components within normal limits  COMPREHENSIVE METABOLIC PANEL - Abnormal; Notable for the following components:   Glucose, Bld 151 (*)    Total Bilirubin 2.5 (*)    All other components within normal limits  LIPASE, BLOOD  TROPONIN I (HIGH SENSITIVITY)  TROPONIN I (HIGH SENSITIVITY)   ____________________________________________  EKG  ED ECG REPORT I, Rudene Re, the attending physician, personally viewed and interpreted this ECG.  Sinus bradycardia first-degree AV block, rate of 48, normal QTC, normal axis, no ST elevations or depressions ____________________________________________  RADIOLOGY  I have personally reviewed the images performed during this visit and I agree with the Radiologist's read.   Interpretation by Radiologist:  CT ABDOMEN PELVIS WO CONTRAST  Result Date: 02/13/2021 CLINICAL DATA:  Nonlocalized acute abdominal pain. EXAM: CT ABDOMEN AND PELVIS WITHOUT CONTRAST TECHNIQUE: Multidetector CT imaging of the abdomen and pelvis  was performed following the standard protocol without IV contrast. COMPARISON:  Ultrasound abdomen 02/13/2021. FINDINGS: Lower chest: Bibasilar atelectasis. Bilateral lower lobe mild bronchiectasis and reticulations. Coronary artery calcifications. Hepatobiliary: No focal liver abnormality. Multiple subcentimeter calcified gallstones within the gallbladder lumen. No gallbladder wall thickening or pericholecystic fluid. No biliary dilatation. Pancreas: No focal lesion. Normal pancreatic contour. No surrounding inflammatory changes. No main pancreatic ductal dilatation. Spleen: Normal in size without focal abnormality. Adrenals/Urinary Tract: No adrenal nodule bilaterally. No nephrolithiasis, no hydronephrosis, and no contour-deforming renal mass. No ureterolithiasis or hydroureter. The urinary bladder is unremarkable. Stomach/Bowel: Stomach is within normal limits. No evidence of bowel wall thickening or dilatation. Scattered sigmoid and left colon diverticulosis. The appendix is not definitely identified. No right lower quadrant inflammatory changes. Vascular/Lymphatic: No abdominal aorta or iliac aneurysm. Moderate atherosclerotic plaque of the aorta and its branches. No abdominal, pelvic, or inguinal lymphadenopathy. Reproductive: Prostate is unremarkable. Other: No intraperitoneal free fluid. No intraperitoneal free gas. No organized fluid collection. Musculoskeletal: No abdominal wall hernia or abnormality. No suspicious lytic or  blastic osseous lesions. No acute displaced fracture. Multilevel degenerative changes of the spine. IMPRESSION: 1. Cholelithiasis with no CT findings of acute cholecystitis or choledocholithiasis. 2. Colonic diverticulosis with no acute diverticulitis. 3. Aortic Atherosclerosis (ICD10-I70.0) including coronary artery calcifications. 4. Mild bilateral lower lobe pulmonary fibrosis. Electronically Signed   By: Iven Finn M.D.   On: 02/13/2021 06:43   US ABDOMEN LIMITED RUQ  (LIVER/GB)  Result Date: 02/13/2021 CLINICAL DATA:  65 year old male with right side abdominal pain. EXAM: ULTRASOUND ABDOMEN LIMITED RIGHT UPPER QUADRANT COMPARISON:  None. FINDINGS: Gallbladder: Multiple shadowing echogenic gallstones, individually estimated at 15 mm diameter (image 6). Superimposed sludge. Elongated gallbladder, about 12.5 cm in length. Gallbladder wall thickness is borderline at 3 mm (image 9). But no sonographic Murphy sign was elicited. No pericholecystic fluid identified. Common bile duct: Diameter: 3 mm, normal. Liver: Echogenic liver (image 28). No discrete liver lesion or intrahepatic biliary ductal dilatation identified. Portal vein is patent on color Doppler imaging with normal direction of blood flow towards the liver. Other: Negative visible right kidney. IMPRESSION: 1. Gallbladder sludge and stones with borderline wall thickening, but no sonographic Murphy sign to strongly suggest acute cholecystitis. 2. Fatty liver disease.  No evidence of bile duct obstruction. Electronically Signed   By: Genevie Ann M.D.   On: 02/13/2021 05:51      ____________________________________________   PROCEDURES  Procedure(s) performed:yes .1-3 Lead EKG Interpretation Performed by: Rudene Re, MD Authorized by: Rudene Re, MD     Interpretation: non-specific     ECG rate assessment: bradycardic     Rhythm: sinus bradycardia     Ectopy: none     Conduction: normal     Critical Care performed:  None ____________________________________________   INITIAL IMPRESSION / ASSESSMENT AND PLAN / ED COURSE   65 y.o. male with a history of hypertension hyperlipidemia who presents for evaluation of abdominal pain.  Patient with epigastric and right upper quadrant tenderness, localized guarding and positive Murphy sign.  Vitals are within normal limits other than sinus bradycardia.  Differential diagnosis includes gallbladder pathology versus pancreatitis versus peptic ulcer  disease versus gastritis versus volvulus versus SBO versus appendicitis versus kidney stone versus pyelonephritis.  Labs showing mild leukocytosis with white count of 11.6.  Normal LFTs with mildly elevated T bili of 2.5, normal lipase of 39.  EKG and troponin with no signs of cardiac ischemia.  Right upper quadrant ultrasound showed cholelithiasis with no evidence of cholecystitis.  IV fentanyl and Zofran for symptom relief.  Old medical records reviewed.  _________________________ 7:24 AM on 02/13/2021 -----------------------------------------  CT with no alternative etiology for patient's pain.  After IV fentanyl and Toradol patient still complain of 3 out of 10 and is still pretty tender on the right upper quadrant.  Therefore I discussed patient with Dr. Lysle Pearl from surgery who will evaluate patient in the emergency room for possible admission. Dispo per Dr. Lysle Pearl    _____________________________________________ Please note:  Patient was evaluated in Emergency Department today for the symptoms described in the history of present illness. Patient was evaluated in the context of the global COVID-19 pandemic, which necessitated consideration that the patient might be at risk for infection with the SARS-CoV-2 virus that causes COVID-19. Institutional protocols and algorithms that pertain to the evaluation of patients at risk for COVID-19 are in a state of rapid change based on information released by regulatory bodies including the CDC and federal and state organizations. These policies and algorithms were followed during the patient's care  in the ED.  Some ED evaluations and interventions may be delayed as a result of limited staffing during the pandemic.   [place] Controlled Substance Database was reviewed by me. ____________________________________________   FINAL CLINICAL IMPRESSION(S) / ED DIAGNOSES   Final diagnoses:  RUQ abdominal pain  Symptomatic cholelithiasis      NEW MEDICATIONS  STARTED DURING THIS VISIT:  ED Discharge Orders    None       Note:  This document was prepared using Dragon voice recognition software and may include unintentional dictation errors.    Alfred Levins, [place], MD 02/13/21 [phone]

## 2021-02-13 NOTE — ED Triage Notes (Addendum)
Pt with upper quadrants abd pain, mostly right since 2100 with vomiting/nausea. Pt denies radiation to back. Pt does not appear diphoretic, but appears uncomfortable with mild distress, pt denies shob, dizziness.

## 2021-02-14 LAB — PHOSPHORUS: Phosphorus: 2.7 mg/dL (ref 2.5–4.6)

## 2021-02-14 LAB — BASIC METABOLIC PANEL
Anion gap: 9 (ref 5–15)
BUN: 19 mg/dL (ref 8–23)
CO2: 24 mmol/L (ref 22–32)
Calcium: 8.3 mg/dL — ABNORMAL LOW (ref 8.9–10.3)
Chloride: 104 mmol/L (ref 98–111)
Creatinine, Ser: 1.16 mg/dL (ref 0.61–1.24)
GFR, Estimated: >60 mL/min (ref >60)
Glucose, Bld: 157 mg/dL — ABNORMAL HIGH (ref 70–99)
Potassium: 4.1 mmol/L (ref 3.5–5.1)
Sodium: 137 mmol/L (ref 135–145)

## 2021-02-14 LAB — HEPATIC FUNCTION PANEL
ALT: 41 U/L (ref 0–44)
AST: 47 U/L — ABNORMAL HIGH (ref 15–41)
Albumin: 3.3 g/dL — ABNORMAL LOW (ref 3.5–5.0)
Alkaline Phosphatase: 44 U/L (ref 38–126)
Bilirubin, Direct: 0.4 mg/dL — ABNORMAL HIGH (ref 0.0–0.2)
Indirect Bilirubin: 3.1 mg/dL — ABNORMAL HIGH (ref 0.3–0.9)
Total Bilirubin: 3.5 mg/dL — ABNORMAL HIGH (ref 0.3–1.2)
Total Protein: 6.5 g/dL (ref 6.5–8.1)

## 2021-02-14 LAB — CBC
HCT: 38.6 % — ABNORMAL LOW (ref 39.0–52.0)
Hemoglobin: 13.3 g/dL (ref 13.0–17.0)
MCH: 31.9 pg (ref 26.0–34.0)
MCHC: 34.5 g/dL (ref 30.0–36.0)
MCV: 92.6 fL (ref 80.0–100.0)
Platelets: 216 10*3/uL (ref 150–400)
RBC: 4.17 MIL/uL — ABNORMAL LOW (ref 4.22–5.81)
RDW: 12.9 % (ref 11.5–15.5)
WBC: 16.6 10*3/uL — ABNORMAL HIGH (ref 4.0–10.5)
nRBC: 0 % (ref 0.0–0.2)

## 2021-02-14 LAB — MAGNESIUM: Magnesium: 1.9 mg/dL (ref 1.7–2.4)

## 2021-02-14 MED ORDER — ACETAMINOPHEN 325 MG PO TABS: 650.0000 mg | ORAL_TABLET | Freq: Three times a day (TID) | ORAL | 0 refills | Status: AC | PRN

## 2021-02-14 MED ORDER — AMOXICILLIN-POT CLAVULANATE 875-125 MG PO TABS: 1.0000 | ORAL_TABLET | Freq: Two times a day (BID) | ORAL | 0 refills | Status: AC

## 2021-02-14 MED ORDER — DOCUSATE SODIUM 100 MG PO CAPS: 100.0000 mg | ORAL_CAPSULE | Freq: Two times a day (BID) | ORAL | 0 refills | Status: AC | PRN

## 2021-02-14 MED ORDER — HYDROCODONE-ACETAMINOPHEN 5-325 MG PO TABS: 1.0000 | ORAL_TABLET | Freq: Four times a day (QID) | ORAL | 0 refills | Status: AC | PRN

## 2021-02-14 MED ORDER — IBUPROFEN 800 MG PO TABS: 800.0000 mg | ORAL_TABLET | Freq: Three times a day (TID) | ORAL | 0 refills | Status: AC | PRN

## 2021-02-14 NOTE — Progress Notes (Signed)
Mobility Specialist - Progress Note   02/14/21 1200  Mobility  Activity Ambulated in room  Level of Assistance Independent  Assistive Device None  Distance Ambulated (ft) 25 ft  Mobility Ambulated independently in room  Mobility Response Tolerated well  Mobility performed by Mobility specialist  $Mobility charge 1 Mobility    Pt standing at bedside on arrival. Independent. Mild pain 4/10 with activity. No AD, no LOB.    Kathee Delton Mobility Specialist 02/14/21, 1:01 PM

## 2021-02-14 NOTE — Discharge Instructions (Signed)
Laparoscopic Cholecystectomy, Care After This sheet gives you information about how to care for yourself after your procedure. Your doctor may also give you more specific instructions. If you have problems or questions, contact your doctor. Follow these instructions at home: Care for cuts from surgery (incisions)   Follow instructions from your doctor about how to take care of your cuts from surgery. Make sure you: ? Wash your hands with soap and water before you change your bandage (dressing). If you cannot use soap and water, use hand sanitizer. ? Keep right side dressing intact for 48hrs, then ok to remove.  Keep area covered with band-aid until completely healed.  Change band-aid daily.   ? Leave stitches (sutures), skin glue, or skin tape (adhesive) strips in place. They may need to stay in place for 2 weeks or longer. If tape strips get loose and curl up, you may trim the loose edges. Do not remove tape strips completely unless your doctor says it is okay.  Do not take baths, swim, or use a hot tub until your doctor says it is okay. OK TO SHOWER in 48HRS Check your surgical cut area every day for signs of infection. Check for: ? More redness, swelling, or pain. ? More fluid or blood. ? Warmth. ? Pus or a bad smell. Activity  Do not drive or use heavy machinery while taking prescription pain medicine.  Do not play contact sports until your doctor says it is okay.  Do not drive for 24 hours if you were given a medicine to help you relax (sedative).  Rest as needed. Do not return to work or school until your doctor says it is okay. General instructions .  tylenol and advil as needed for discomfort.  Please alternate between the two every four hours as needed for pain.   .  Use narcotics, if prescribed, only when tylenol and motrin is not enough to control pain. .  325-650mg  every 8hrs to max of 3000mg /24hrs (including the 325mg  in every norco dose) for the tylenol.   .  Advil up to  800mg  per dose every 8hrs as needed for pain.    To prevent or treat constipation while you are taking prescription pain medicine, your doctor may recommend that you: ? Drink enough fluid to keep your pee (urine) clear or pale yellow. ? Take over-the-counter or prescription medicines. ? Eat foods that are high in fiber, such as fresh fruits and vegetables, whole grains, and beans. ? Limit foods that are high in fat and processed sugars, such as fried and sweet foods. Contact a doctor if:  You develop a rash.  You have more redness, swelling, or pain around your surgical cuts.  You have more fluid or blood coming from your surgical cuts.  Your surgical cuts feel warm to the touch.  You have pus or a bad smell coming from your surgical cuts.  You have a fever.  One or more of your surgical cuts breaks open. Get help right away if:  You have trouble breathing.  You have chest pain.  You have pain that is getting worse in your shoulders.  You faint or feel dizzy when you stand.  You have very bad pain in your belly (abdomen).  You are sick to your stomach (nauseous) for more than one day.  You have throwing up (vomiting) that lasts for more than one day.  You have leg pain. This information is not intended to replace advice given to you by  your health care provider. Make sure you discuss any questions you have with your health care provider. Document Released: 06/05/2008 Document Revised: 03/17/2016 Document Reviewed: 02/13/2016 Elsevier Interactive Patient Education  2019 [employer].

## 2021-02-14 NOTE — Op Note (Signed)
Preoperative diagnosis:  acute and cholecystitis  Postoperative diagnosis: same as above  Procedure: Robotic assisted Laparoscopic Cholecystectomy.   Anesthesia: GETA   Surgeon: Benjamine Sprague  Specimen: Gallbladder  Complications: None  EBL: 46mL  Wound Classification: Clean Contaminated  Indications: see HPI  Findings: Critical view of safety noted Cystic duct and artery identified, ligated and divided, clips remained intact at end of procedure Adequate hemostasis  Description of procedure:  The patient was placed on the operating table in the supine position. SCDs placed, pre-op abx administered.  General anesthesia was induced and OG tube placed by anesthesia. A time-out was completed verifying correct patient, procedure, site, positioning, and implant(s) and/or special equipment prior to beginning this procedure. The abdomen was prepped and draped in the usual sterile fashion.    Veress needle was placed at the Palmer's point and insufflation was started after confirming a positive saline drop test and no immediate increase in abdominal pressure.  After reaching 15 mm, the Veress needle was removed and a 8 mm port was placed via optiview technique under umbilicus measured 39JQ from gallbladder.  The abdomen was inspected and no abnormalities or injuries were found.  Under direct vision, ports were placed in the following locations: One 12 mm patient left of the umbilicus, 8cm from the optiviewed port, one 8 mm port placed to the patient right of the umbilical port 8 cm apart.  1 additional 8 mm port placed lateral to the 34mm port.  Once ports were placed, The table was placed in the reverse Trendelenburg position with the right side up. The Xi platform was brought into the operative field and docked to the ports successfully.  An endoscope was placed through the umbilical port, fenestrated grasper through the adjacent patient right port, prograsp to the far patient left port, and then  a hook cautery in the left port.  The dome of the gallbladder was grasped with prograsp after making a small hole to suction out the excess bile, passed and retracted over the dome of the liver. Adhesions between the gallbladder and omentum, duodenum and transverse colon were lysed via hook cautery blunt dissection with IT consultant. The infundibulum was grasped with the fenestrated grasper and retracted toward the right lower quadrant. This maneuver exposed Calot's triangle. The peritoneum overlying the gallbladder infundibulum was then dissected using combination of [place] dissector and electrocautery hook and the cystic duct and cystic artery eventually identified.  Critical view of safety with the liver bed clearly visible behind the duct and artery with no additional structures noted.  The cystic duct and cystic artery clipped and divided close to the gallbladder.     The gallbladder was then dissected from its peritoneal and liver bed attachments by electrocautery.  The dense adhesion and necrotic gallbladder made finding a optimal plane difficult leading to some bleeding from the liver.  This bleeding was eventually controlled with combination of cautery, manual pressure, and application of Surgicel.   Due to the gangrenous nature of the gallbladder, as well as the bleeding issues, 15 [place] round Blake drain was placed within the gallbladder fossa to monitor for any future issues.  This was placed through the right lower quadrant port and secured to the skin using 3-0 nylon.  Endo Catch bag was then placed through the 12 mm port and the gallbladder was removed.  The gallbladder was passed off the table as a specimen. There was no evidence of bleeding from the gallbladder fossa or cystic artery or leakage of  the bile from the cystic duct stump.  Excess blood and bile leakage was then suctioned out completely.  The 12 mm port site closed with PMI using 0 vicryl under direct vision.  Abdomen  desufflated and secondary trocars were removed under direct vision. No bleeding was noted. All skin incisions then closed with subcuticular sutures of 4-0 monocryl and dressed with topical skin adhesive. The orogastric tube was removed and patient extubated.  The patient tolerated the procedure well and was taken to the postanesthesia care unit in stable condition.  All sponge and instrument count correct at end of procedure.

## 2021-02-14 NOTE — Progress Notes (Signed)
Patient and wife given instructions to keep follow up appointments, when to return for worsening symptoms, IV taken out, JP Drain out & dressing education given, & awaiting wheelchair.

## 2021-02-14 NOTE — Discharge Summary (Signed)
Physician Discharge Summary  Patient ID: LEVORN OLESKI MRN: [id number] DOB/AGE: 04/14/1956 65 y.o.  Admit date: 02/13/2021 Discharge date: 02/14/21  Admission Diagnoses: acute cholecystitis  Discharge Diagnoses:  Same as above  Discharged Condition: good  Hospital Course: admitted for above. eleavted LFTs so MRCP performed which showed no CBD stone.  Proceeded with robo lap chole, complicated by extremely inflammaed gallbladder.  See op note for details.  Post op, Keenan Bachelor drain was pulled accidentally, but pain controlled, tolerating regular diet, so will d/c home with close f/u.  Consults: None  Discharge Exam: Blood pressure 108/64, pulse 60, temperature 98.4 F (36.9 C), temperature source Oral, resp. rate 20, height 6\' 1"  (1.854 m), weight 104.3 kg, SpO2 98 %. General appearance: alert, cooperative and no distress GI: soft, TTP around incision sites.  Right lateral former drain site open but no active bleeding  Disposition:  Discharge disposition: 01-Home or Self Care       Discharge Instructions    Discharge patient   Complete by: As directed    Discharge disposition: 01-Home or Self Care   Discharge patient date: 02/14/2021     Allergies as of 02/14/2021   No Known Allergies     Medication List    STOP taking these medications   albuterol 108 (90 Base) MCG/ACT inhaler Commonly known as: VENTOLIN HFA   ascorbic acid 500 MG tablet Commonly known as: VITAMIN C   dexamethasone 6 MG tablet Commonly known as: DECADRON   Vitamin D3 25 MCG tablet Commonly known as: Vitamin D   zinc sulfate 220 (50 Zn) MG capsule     TAKE these medications   acetaminophen 325 MG tablet Commonly known as: Tylenol Take 2 tablets (650 mg total) by mouth every 8 (eight) hours as needed for mild pain.   amoxicillin-clavulanate 875-125 MG tablet Commonly known as: Augmentin Take 1 tablet by mouth 2 (two) times daily for 7 days.   docusate sodium 100 MG capsule Commonly known  as: Colace Take 1 capsule (100 mg total) by mouth 2 (two) times daily as needed for up to 10 days for mild constipation.   HYDROcodone-acetaminophen 5-325 MG tablet Commonly known as: Norco Take 1 tablet by mouth every 6 (six) hours as needed for up to 6 doses for moderate pain.   ibuprofen 800 MG tablet Commonly known as: ADVIL Take 1 tablet (800 mg total) by mouth every 8 (eight) hours as needed for mild pain or moderate pain.   simvastatin 20 MG tablet Commonly known as: ZOCOR Take 20 mg by mouth daily.   valsartan-hydrochlorothiazide 160-12.5 MG tablet Commonly known as: DIOVAN-HCT Take 1 tablet by mouth daily.       Follow-up Information    Yoana Staib, DO Follow up in 1 week(s).   Specialty: Surgery Why: for post op wound check, lap chole Contact information: [address] [place] [address] [phone]                Total time spent arranging discharge was >62min. Signed: Benjamine Sprague 02/14/2021, 12:02 PM

## 2021-02-15 LAB — SURGICAL PATHOLOGY

## 2021-04-26 DIAGNOSIS — L570 Actinic keratosis: Secondary | ICD-10-CM | POA: Diagnosis not present

## 2021-04-26 DIAGNOSIS — X32XXXD Exposure to sunlight, subsequent encounter: Secondary | ICD-10-CM | POA: Diagnosis not present

## 2021-04-26 DIAGNOSIS — B078 Other viral warts: Secondary | ICD-10-CM | POA: Diagnosis not present

## 2021-08-26 IMAGING — MR MR ABDOMEN WO/W CM MRCP
20 of 22 series · 44 of 48 positions shown · IV contrast (10ml Gadavist)
Comparison: CT and ultrasound examinations 02/13/2021

CLINICAL DATA: Epigastric and right upper quadrant abdominal pain.
Gallstones noted on prior ultrasound and CT scan.

EXAM:
MRI ABDOMEN WITHOUT AND WITH CONTRAST (INCLUDING MRCP)
TECHNIQUE: Multiplanar multisequence MR imaging of the abdomen was performed
both before and after the administration of intravenous contrast.
Heavily T2-weighted images of the biliary and pancreatic ducts were
obtained, and three-dimensional MRCP images were rendered by post
processing.
CONTRAST:  10mL GADAVIST GADOBUTROL 1 MMOL/ML IV SOLN

[Series 3: T2 · coronal · 6.0mm · 1.19mm/px · 1 of 35 slices shown (1 of 2)]
[im 1/35]
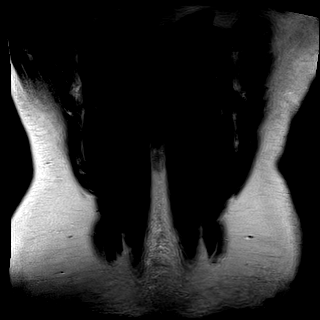

[Series 4: T2 · axial · 6.0mm · 1.31mm/px · 1 of 39 slices shown (2 of 2)]
[im 1/39]
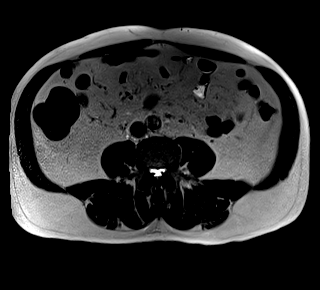

[Series 5: T1 · axial · 3.0mm · 1.31mm/px · z∈[-142,+119]mm · 2 of 88 slices shown (1 of 2)]
[im 1/88]
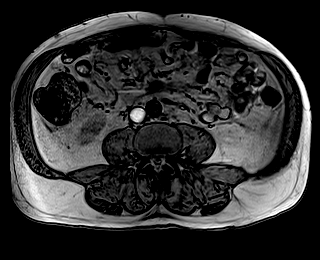
[im 88/88]
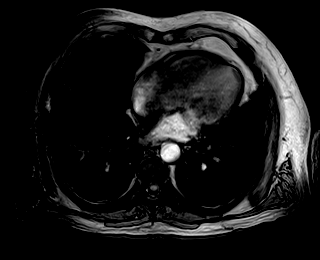

[Series 6: T1 · axial · 3.0mm · 1.31mm/px · z∈[-142,+119]mm · 2 of 88 slices shown (2 of 2)]
[im 1/88]
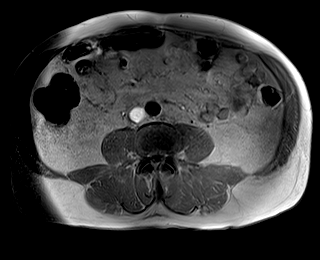
[im 88/88]
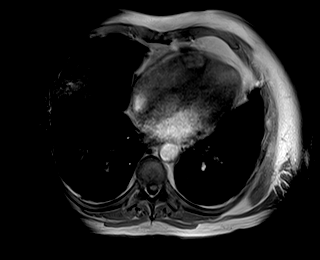

[Series 9: T2 fat-sat · axial · 6.0mm · 1.31mm/px · 1 of 39 slices shown]
[im 1/39]
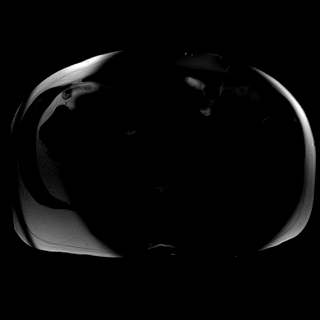

[Series 10: ax dwi_tracew · axial · 6.0mm · 1.57mm/px · z∈[-147,+134]mm · 3 of 120 slices shown]
[im 1/120]
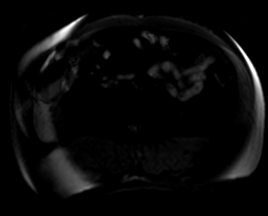
[im 60/120]
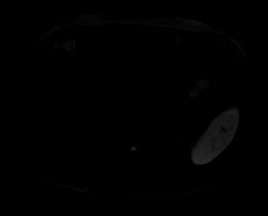
[im 120/120]
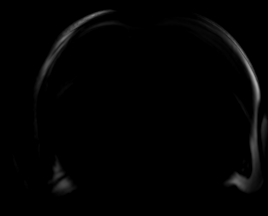

[Series 11: ax dwi_adc · axial · 6.0mm · 1.57mm/px · 1 of 40 slices shown]
[im 1/40]
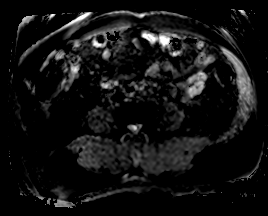

[Series 15: MRCP · coronal · 3.0mm · 1.31mm/px · 1 of 23 slices shown]
[im 1/23]
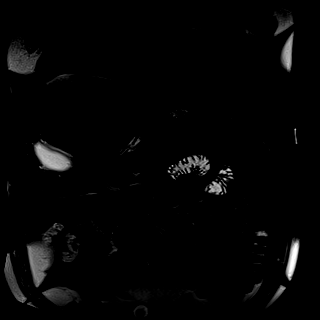

[Series 16: radials · coronal · 50.0mm · 0.78mm/px · 1 of 5 slices shown (1 of 2)]
[im 1/5]
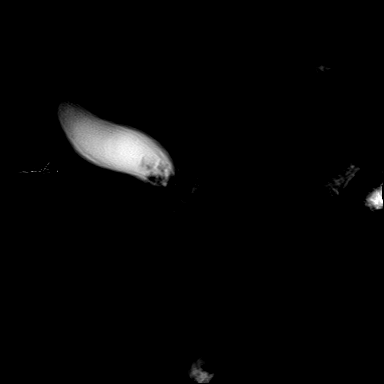

[Series 17: T1 dynamic fat-sat · axial · non-contrast · 3.0mm · 1.31mm/px · z∈[-142,+119]mm · 3 of 88 slices shown (1 of 5)]
[im 1/88]
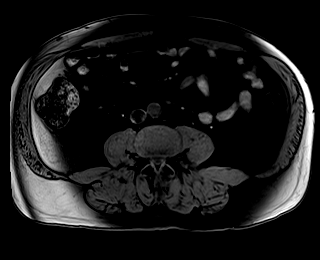
[im 44/88]
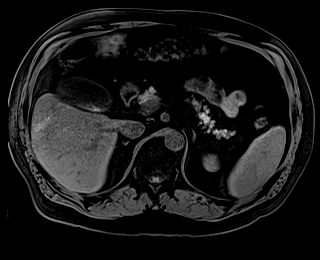
[im 88/88]
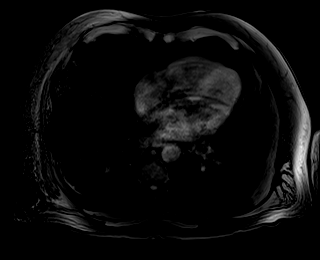

[Series 18: radials · coronal · 50.0mm · 0.78mm/px · 1 of 5 slices shown (2 of 2)]
[im 1/5]
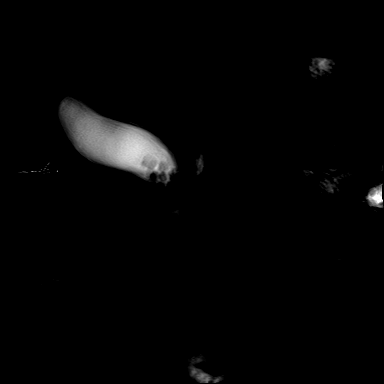

[Series 19: T1 dynamic fat-sat post-contrast · axial · 3.0mm · 1.31mm/px · z∈[-142,+119]mm · 3 of 88 slices shown (1 of 4)]
[im 1/88]
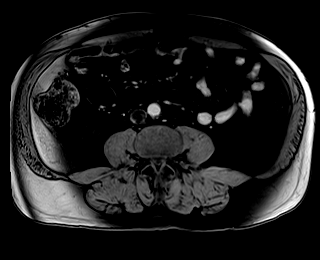
[im 44/88]
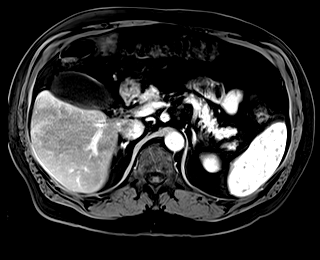
[im 88/88]
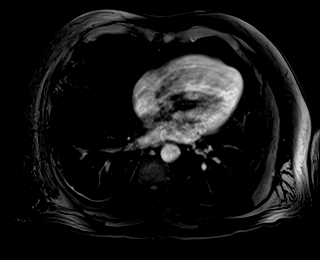

[Series 20: T1 dynamic fat-sat · axial · 3.0mm · 1.31mm/px · z∈[-142,+119]mm · 3 of 88 slices shown (2 of 5)]
[im 1/88]
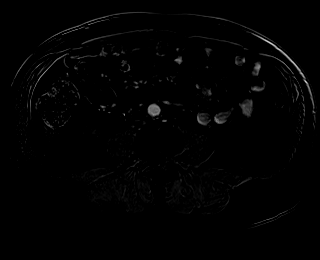
[im 44/88]
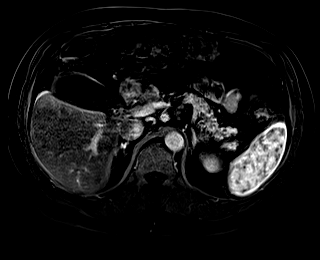
[im 88/88]
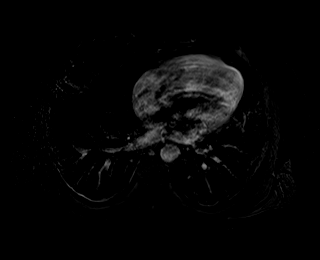

[Series 21: T1 dynamic fat-sat post-contrast · axial · 3.0mm · 1.31mm/px · z∈[-142,+119]mm · 3 of 88 slices shown (2 of 4)]
[im 1/88]
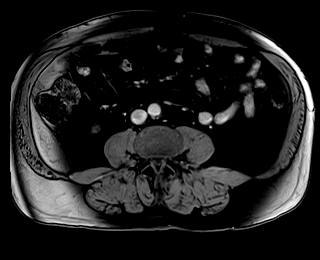
[im 44/88]
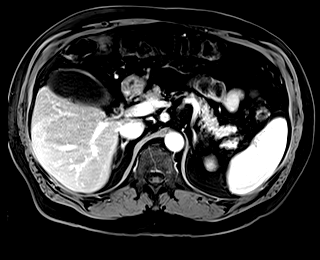
[im 88/88]
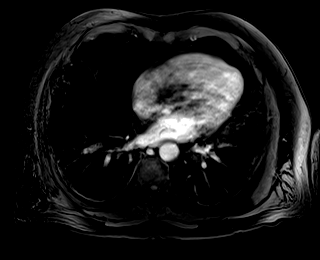

[Series 22: T1 dynamic fat-sat · axial · 3.0mm · 1.31mm/px · z∈[-142,+119]mm · 3 of 88 slices shown (3 of 5)]
[im 1/88]
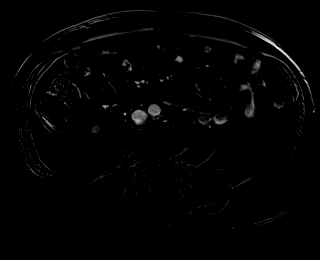
[im 44/88]
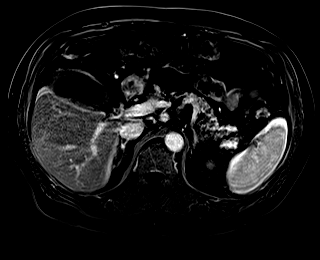
[im 88/88]
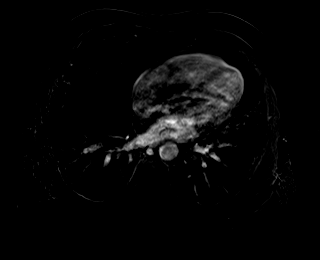

[Series 23: T1 dynamic fat-sat post-contrast · axial · 3.0mm · 1.31mm/px · z∈[-142,+119]mm · 3 of 88 slices shown (3 of 4)]
[im 1/88]
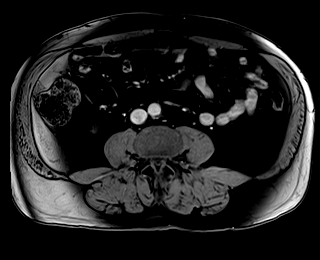
[im 44/88]
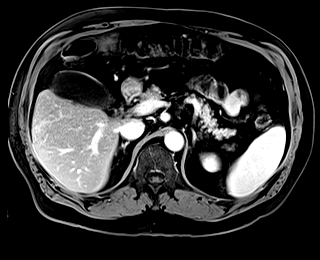
[im 88/88]
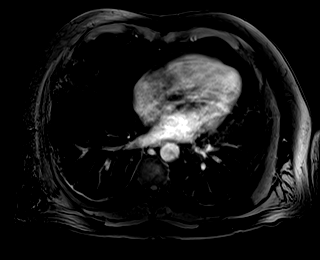

[Series 24: T1 dynamic fat-sat · axial · 3.0mm · 1.31mm/px · z∈[-142,+119]mm · 3 of 88 slices shown (4 of 5)]
[im 1/88]
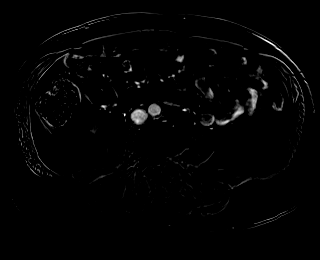
[im 44/88]
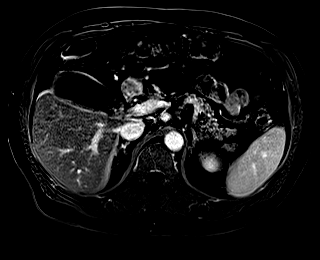
[im 88/88]
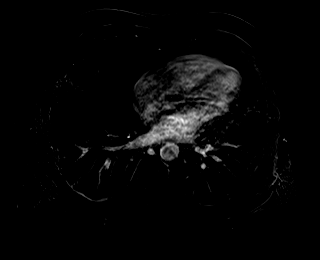

[Series 25: T1 dynamic post-contrast · coronal · 3.0mm · 1.31mm/px · 3 of 88 slices shown]
[im 1/88]
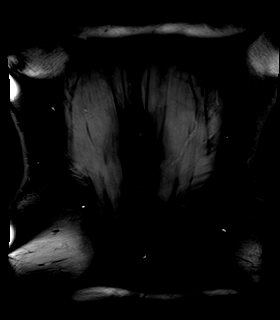
[im 44/88]
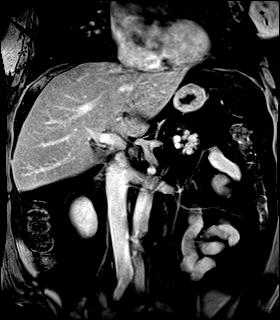
[im 88/88]
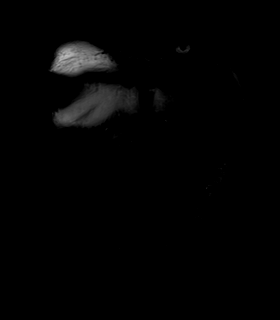

[Series 26: T1 dynamic fat-sat post-contrast · axial · 3.0mm · 1.31mm/px · z∈[-142,+119]mm · 3 of 88 slices shown (4 of 4)]
[im 1/88]
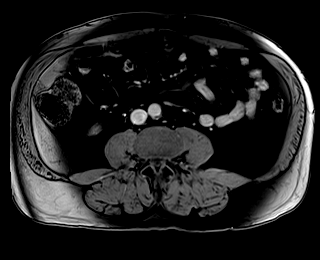
[im 44/88]
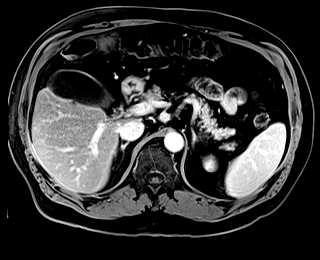
[im 88/88]
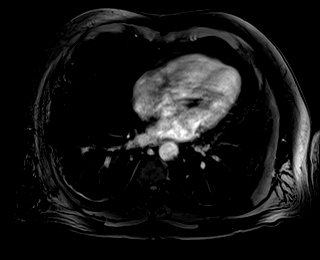

[Series 27: T1 dynamic fat-sat · axial · 3.0mm · 1.31mm/px · z∈[-142,+119]mm · 3 of 88 slices shown (5 of 5)]
[im 1/88]
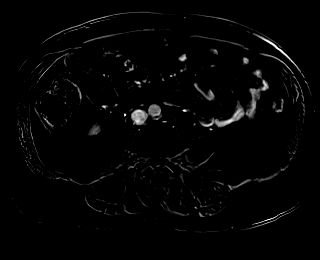
[im 44/88]
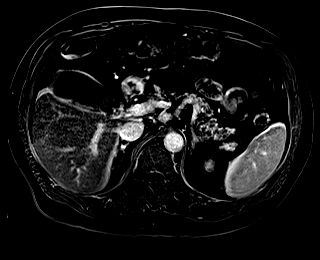
[im 88/88]
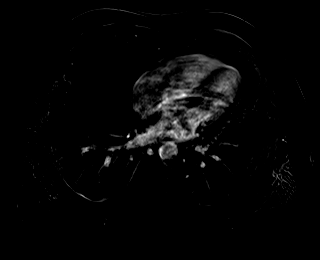

[44 of 48 positions shown; findings below may reference images not displayed]

FINDINGS: Lower chest: The lung bases are grossly clear of an acute process.
No pleural effusions or pericardial effusion.

Hepatobiliary: Mild diffuse fatty infiltration of the liver but no
worrisome hepatic lesions or intrahepatic biliary dilatation. The
portal and hepatic veins are patent.

Numerous gallstones are noted layering dependently in the
gallbladder along with sludge. Very mild gallbladder wall thickening
but no pericholecystic fluid or pericholecystic inflammatory
changes. Normal caliber and course of the common bile duct. No
common bile duct stones.

Pancreas:  No mass, inflammation or ductal dilatation.

Spleen:  Normal size.  No focal lesions.

Adrenals/Urinary Tract: The adrenal glands and kidneys are
unremarkable.

Stomach/Bowel: The stomach, duodenum, visualized small bowel and
visualized colon are grossly normal.

Vascular/Lymphatic: The aorta and branch vessels are patent. The
major venous structures are patent. No mesenteric or retroperitoneal
mass or adenopathy.

Other:  No ascites or abdominal wall hernia.

Musculoskeletal: No significant bony findings.
IMPRESSION: 1. Cholelithiasis with mild gallbladder wall thickening but no
pericholecystic fluid or pericholecystic inflammatory changes.
2. Normal caliber and course of the common bile duct. No common bile
duct stones.
3. Mild diffuse fatty infiltration of the liver but no worrisome
hepatic lesions or intrahepatic biliary dilatation.

## 2021-09-12 DIAGNOSIS — L570 Actinic keratosis: Secondary | ICD-10-CM | POA: Diagnosis not present

## 2021-09-12 DIAGNOSIS — X32XXXD Exposure to sunlight, subsequent encounter: Secondary | ICD-10-CM | POA: Diagnosis not present

## 2021-11-22 DIAGNOSIS — L570 Actinic keratosis: Secondary | ICD-10-CM | POA: Diagnosis not present

## 2021-11-22 DIAGNOSIS — X32XXXD Exposure to sunlight, subsequent encounter: Secondary | ICD-10-CM | POA: Diagnosis not present

## 2021-11-22 DIAGNOSIS — L821 Other seborrheic keratosis: Secondary | ICD-10-CM | POA: Diagnosis not present

## 2022-01-23 DIAGNOSIS — I10 Essential (primary) hypertension: Secondary | ICD-10-CM | POA: Diagnosis not present

## 2022-01-23 DIAGNOSIS — E785 Hyperlipidemia, unspecified: Secondary | ICD-10-CM | POA: Diagnosis not present

## 2022-01-23 DIAGNOSIS — Z125 Encounter for screening for malignant neoplasm of prostate: Secondary | ICD-10-CM | POA: Diagnosis not present

## 2022-01-31 DIAGNOSIS — I10 Essential (primary) hypertension: Secondary | ICD-10-CM | POA: Diagnosis not present

## 2022-01-31 DIAGNOSIS — Z0000 Encounter for general adult medical examination without abnormal findings: Secondary | ICD-10-CM | POA: Diagnosis not present

## 2022-01-31 DIAGNOSIS — E785 Hyperlipidemia, unspecified: Secondary | ICD-10-CM | POA: Diagnosis not present

## 2022-01-31 DIAGNOSIS — Z1389 Encounter for screening for other disorder: Secondary | ICD-10-CM | POA: Diagnosis not present

## 2022-01-31 DIAGNOSIS — R0602 Shortness of breath: Secondary | ICD-10-CM | POA: Diagnosis not present

## 2022-01-31 DIAGNOSIS — Z125 Encounter for screening for malignant neoplasm of prostate: Secondary | ICD-10-CM | POA: Diagnosis not present

## 2022-02-22 DIAGNOSIS — R0602 Shortness of breath: Secondary | ICD-10-CM | POA: Diagnosis not present

## 2022-02-22 DIAGNOSIS — E7800 Pure hypercholesterolemia, unspecified: Secondary | ICD-10-CM | POA: Diagnosis not present

## 2022-02-22 DIAGNOSIS — I10 Essential (primary) hypertension: Secondary | ICD-10-CM | POA: Diagnosis not present

## 2022-03-09 DIAGNOSIS — R0602 Shortness of breath: Secondary | ICD-10-CM | POA: Diagnosis not present

## 2022-03-15 DIAGNOSIS — R0602 Shortness of breath: Secondary | ICD-10-CM | POA: Diagnosis not present

## 2022-03-15 DIAGNOSIS — E7800 Pure hypercholesterolemia, unspecified: Secondary | ICD-10-CM | POA: Diagnosis not present

## 2022-03-15 DIAGNOSIS — I10 Essential (primary) hypertension: Secondary | ICD-10-CM | POA: Diagnosis not present

## 2022-04-23 DIAGNOSIS — L570 Actinic keratosis: Secondary | ICD-10-CM | POA: Diagnosis not present

## 2022-04-23 DIAGNOSIS — X32XXXD Exposure to sunlight, subsequent encounter: Secondary | ICD-10-CM | POA: Diagnosis not present

## 2022-07-24 DIAGNOSIS — U071 COVID-19: Secondary | ICD-10-CM | POA: Diagnosis not present

## 2022-07-24 DIAGNOSIS — E7800 Pure hypercholesterolemia, unspecified: Secondary | ICD-10-CM | POA: Diagnosis not present

## 2022-07-24 DIAGNOSIS — J9600 Acute respiratory failure, unspecified whether with hypoxia or hypercapnia: Secondary | ICD-10-CM | POA: Diagnosis not present

## 2022-07-24 DIAGNOSIS — I10 Essential (primary) hypertension: Secondary | ICD-10-CM | POA: Diagnosis not present

## 2022-07-24 DIAGNOSIS — R0602 Shortness of breath: Secondary | ICD-10-CM | POA: Diagnosis not present

## 2022-10-18 DIAGNOSIS — E538 Deficiency of other specified B group vitamins: Secondary | ICD-10-CM | POA: Diagnosis not present

## 2022-10-18 DIAGNOSIS — G629 Polyneuropathy, unspecified: Secondary | ICD-10-CM | POA: Diagnosis not present

## 2022-10-18 DIAGNOSIS — I10 Essential (primary) hypertension: Secondary | ICD-10-CM | POA: Diagnosis not present

## 2022-10-18 DIAGNOSIS — E785 Hyperlipidemia, unspecified: Secondary | ICD-10-CM | POA: Diagnosis not present

## 2023-01-29 DIAGNOSIS — Z125 Encounter for screening for malignant neoplasm of prostate: Secondary | ICD-10-CM | POA: Diagnosis not present

## 2023-01-29 DIAGNOSIS — I10 Essential (primary) hypertension: Secondary | ICD-10-CM | POA: Diagnosis not present

## 2023-01-29 DIAGNOSIS — E785 Hyperlipidemia, unspecified: Secondary | ICD-10-CM | POA: Diagnosis not present

## 2023-02-05 DIAGNOSIS — Z0000 Encounter for general adult medical examination without abnormal findings: Secondary | ICD-10-CM | POA: Diagnosis not present

## 2023-02-05 DIAGNOSIS — E538 Deficiency of other specified B group vitamins: Secondary | ICD-10-CM | POA: Diagnosis not present

## 2023-02-05 DIAGNOSIS — E7800 Pure hypercholesterolemia, unspecified: Secondary | ICD-10-CM | POA: Diagnosis not present

## 2023-02-05 DIAGNOSIS — E559 Vitamin D deficiency, unspecified: Secondary | ICD-10-CM | POA: Diagnosis not present

## 2023-02-05 DIAGNOSIS — I10 Essential (primary) hypertension: Secondary | ICD-10-CM | POA: Diagnosis not present

## 2023-02-05 DIAGNOSIS — Z1331 Encounter for screening for depression: Secondary | ICD-10-CM | POA: Diagnosis not present

## 2023-02-05 DIAGNOSIS — Z125 Encounter for screening for malignant neoplasm of prostate: Secondary | ICD-10-CM | POA: Diagnosis not present

## 2023-03-12 DIAGNOSIS — L570 Actinic keratosis: Secondary | ICD-10-CM | POA: Diagnosis not present

## 2023-03-12 DIAGNOSIS — X32XXXD Exposure to sunlight, subsequent encounter: Secondary | ICD-10-CM | POA: Diagnosis not present

## 2023-03-12 DIAGNOSIS — L821 Other seborrheic keratosis: Secondary | ICD-10-CM | POA: Diagnosis not present

## 2023-03-12 DIAGNOSIS — D485 Neoplasm of uncertain behavior of skin: Secondary | ICD-10-CM | POA: Diagnosis not present

## 2023-11-04 DIAGNOSIS — X32XXXD Exposure to sunlight, subsequent encounter: Secondary | ICD-10-CM | POA: Diagnosis not present

## 2023-11-04 DIAGNOSIS — L570 Actinic keratosis: Secondary | ICD-10-CM | POA: Diagnosis not present

## 2023-12-20 DIAGNOSIS — S99922A Unspecified injury of left foot, initial encounter: Secondary | ICD-10-CM | POA: Diagnosis not present

## 2024-02-04 DIAGNOSIS — E538 Deficiency of other specified B group vitamins: Secondary | ICD-10-CM | POA: Diagnosis not present

## 2024-02-04 DIAGNOSIS — E7800 Pure hypercholesterolemia, unspecified: Secondary | ICD-10-CM | POA: Diagnosis not present

## 2024-02-04 DIAGNOSIS — I10 Essential (primary) hypertension: Secondary | ICD-10-CM | POA: Diagnosis not present

## 2024-02-04 DIAGNOSIS — Z125 Encounter for screening for malignant neoplasm of prostate: Secondary | ICD-10-CM | POA: Diagnosis not present

## 2024-02-04 DIAGNOSIS — E559 Vitamin D deficiency, unspecified: Secondary | ICD-10-CM | POA: Diagnosis not present

## 2024-02-11 DIAGNOSIS — Z1331 Encounter for screening for depression: Secondary | ICD-10-CM | POA: Diagnosis not present

## 2024-02-11 DIAGNOSIS — E669 Obesity, unspecified: Secondary | ICD-10-CM | POA: Diagnosis not present

## 2024-02-11 DIAGNOSIS — I10 Essential (primary) hypertension: Secondary | ICD-10-CM | POA: Diagnosis not present

## 2024-02-11 DIAGNOSIS — Z125 Encounter for screening for malignant neoplasm of prostate: Secondary | ICD-10-CM | POA: Diagnosis not present

## 2024-02-11 DIAGNOSIS — Z6834 Body mass index (BMI) 34.0-34.9, adult: Secondary | ICD-10-CM | POA: Diagnosis not present

## 2024-02-11 DIAGNOSIS — Z0000 Encounter for general adult medical examination without abnormal findings: Secondary | ICD-10-CM | POA: Diagnosis not present

## 2024-03-03 DIAGNOSIS — X32XXXD Exposure to sunlight, subsequent encounter: Secondary | ICD-10-CM | POA: Diagnosis not present

## 2024-03-03 DIAGNOSIS — L570 Actinic keratosis: Secondary | ICD-10-CM | POA: Diagnosis not present

## 2024-04-10 DIAGNOSIS — L309 Dermatitis, unspecified: Secondary | ICD-10-CM | POA: Diagnosis not present

## 2024-04-10 DIAGNOSIS — K649 Unspecified hemorrhoids: Secondary | ICD-10-CM | POA: Diagnosis not present

## 2024-08-05 DIAGNOSIS — X32XXXD Exposure to sunlight, subsequent encounter: Secondary | ICD-10-CM | POA: Diagnosis not present

## 2024-08-05 DIAGNOSIS — L570 Actinic keratosis: Secondary | ICD-10-CM | POA: Diagnosis not present
